# Patient Record
Sex: Male | Born: 1980
Health system: Southern US, Community
[De-identification: ages and names within clinical notes are randomized; demographics above are authoritative.]

## PROBLEM LIST (undated history)

## (undated) DIAGNOSIS — J302 Other seasonal allergic rhinitis: Secondary | ICD-10-CM

## (undated) DIAGNOSIS — Z86018 Personal history of other benign neoplasm: Secondary | ICD-10-CM

## (undated) HISTORY — PX: NO PAST SURGERIES: SHX2092

---
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
## 2011-09-18 ENCOUNTER — Encounter (HOSPITAL_COMMUNITY): Payer: Self-pay

## 2011-09-18 ENCOUNTER — Emergency Department (INDEPENDENT_AMBULATORY_CARE_PROVIDER_SITE_OTHER)
Admission: EM | Admit: 2011-09-18 | Discharge: 2011-09-18 | Disposition: A | Discharge status: Home / Self Care | Payer: 59 | Source: Home / Self Care | Attending: Emergency Medicine | Admitting: Emergency Medicine

## 2011-09-18 DIAGNOSIS — J029 Acute pharyngitis, unspecified: Secondary | ICD-10-CM

## 2011-09-18 HISTORY — DX: Other seasonal allergic rhinitis: J30.2

## 2011-09-18 MED ORDER — ACETAMINOPHEN-CODEINE #3 300-30 MG PO TABS: 1.0000 | ORAL_TABLET | Freq: Four times a day (QID) | ORAL | Status: AC | PRN

## 2011-09-18 NOTE — Discharge Instructions (Signed)
Your strep test was negative. At this point is difficult to determine if this is infectious in nature. Use salt water gargles and monitor temperatures throughout the day. If pain it's about 7 or 8 can use his prescribed medicine otherwise take plain Tylenol or Motrin for discomfort. Return if any further concerns or changes for a recheck    Sore Throat Sore throats may be caused by bacteria and viruses. They may also be caused by:  Smoking.   Pollution.   Allergies.  If a sore throat is due to strep infection (a bacterial infection), you may need:  A throat swab.   A culture test to verify the strep infection.  You will need one of these:  An antibiotic shot.   Oral medicine for a full 10 days.  Strep infection is very contagious. A doctor should check any close contacts who have a sore throat or fever. A sore throat caused by a virus infection will usually last only 3-4 days. Antibiotics will not treat a viral sore throat.  Infectious mononucleosis (a viral disease), however, can cause a sore throat that lasts for up to 3 weeks. Mononucleosis can be diagnosed with blood tests. You must have been sick for at least 1 week in order for the test to give accurate results. HOME CARE INSTRUCTIONS   To treat a sore throat, take mild pain medicine.   Increase your fluids.   Eat a soft diet.   Do not smoke.   Gargling with warm water or salt water (1 tsp. salt in 8 oz. water) can be helpful.   Try throat sprays or lozenges or sucking on hard candy to ease the symptoms.  Call your doctor if your sore throat lasts longer than 1 week.  SEEK IMMEDIATE MEDICAL CARE IF:  You have difficulty breathing.   You have increased swelling in the throat.   You have pain so severe that you are unable to swallow fluids or your saliva.   You have a severe headache, a high fever, vomiting, or a red rash.  Document Released: 07/05/2004 Document Revised: 05/17/2011 Document Reviewed:  05/15/2007 [facility] Patient Information 2012 [place], [place].

## 2011-09-18 NOTE — ED Provider Notes (Signed)
History     CSN: [id number]  Arrival date & time 09/18/11  6213   First MD Initiated Contact with Patient 09/18/11 [phone]      Chief Complaint  Patient presents with  . Sore Throat    (Consider location/radiation/quality/duration/timing/severity/associated sxs/prior treatment) HPI Comments: Patient presents urgent care complaining of a sore throat feeling somewhat fatigue and mild body aches since yesterday. Some discomfort when he swallows. Unable to recall if any fevers. No cough shortness of breath or any upper respiratory congestion.  The history is provided by the patient.    Past Medical History  Diagnosis Date  . Seasonal allergies     History reviewed. No pertinent past surgical history.  No family history on file.  History  Substance Use Topics  . Smoking status: Never Smoker   . Smokeless tobacco: Not on file  . Alcohol Use: No      Review of Systems  Constitutional: Positive for fatigue. Negative for fever, chills, appetite change and unexpected weight change.  HENT: Positive for sore throat. Negative for ear pain, congestion, rhinorrhea, mouth sores, trouble swallowing, neck pain and neck stiffness.   Eyes: Negative for discharge.  Respiratory: Negative for cough, shortness of breath and wheezing.   Cardiovascular: Negative for chest pain, palpitations and leg swelling.  Genitourinary: Negative for dysuria.  Skin: Negative for rash.    Allergies  Review of patient's allergies indicates no known allergies.  Home Medications   Current Outpatient Rx  Name Route Sig Dispense Refill  . ACETAMINOPHEN-CODEINE #3 300-30 MG PO TABS Oral Take 1-2 tablets by mouth every 6 (six) hours as needed for pain. 15 tablet 0    BP 107/66  Pulse 80  Temp(Src) 98.2 F (36.8 C) (Oral)  Resp 12  SpO2 98%  Physical Exam  Nursing note and vitals reviewed. Constitutional: He appears well-developed and well-nourished.  HENT:  Head: Normocephalic.  Right Ear: Tympanic  membrane normal.  Left Ear: Tympanic membrane normal.  Nose: Nose normal.  Mouth/Throat: Uvula is midline and oropharynx is clear and moist. No oropharyngeal exudate, posterior oropharyngeal edema, posterior oropharyngeal erythema or tonsillar abscesses.  Eyes: Conjunctivae are normal. Right eye exhibits no discharge.  Neck: Neck supple.  Cardiovascular: Normal rate.  Exam reveals no gallop and no friction rub.   No murmur heard. Pulmonary/Chest: He exhibits no tenderness.  Abdominal: Soft.  Lymphadenopathy:    He has no cervical adenopathy.  Skin: No rash noted. No erythema.    ED Course  Procedures (including critical care time)   Labs Reviewed  POCT RAPID STREP A ([facility]RG CARE ONLY)   No results found.   1. Pharyngitis       MDM   Patient with pharyngitis for less than 24 hours. Afebrile unremarkable exam patient was screened for strep test results were negative. Symptomatic management recommended next 48 hours to return if any worsening or changes or concerns       Jimmie Molly, MD 09/18/11 [phone]

## 2011-09-18 NOTE — ED Notes (Signed)
C/o sore throat, fatigue and body aches since yesterday am.  Denies cold sx.

## 2012-08-05 ENCOUNTER — Ambulatory Visit (INDEPENDENT_AMBULATORY_CARE_PROVIDER_SITE_OTHER): Payer: 59 | Admitting: Adult Health

## 2012-08-05 ENCOUNTER — Encounter: Payer: Self-pay | Admitting: Adult Health

## 2012-08-05 VITALS — BP 110/80 | HR 76 | Temp 97.8°F | Resp 14 | Ht 70.5 in | Wt 171.0 lb

## 2012-08-05 DIAGNOSIS — B353 Tinea pedis: Secondary | ICD-10-CM | POA: Insufficient documentation

## 2012-08-05 DIAGNOSIS — Z111 Encounter for screening for respiratory tuberculosis: Secondary | ICD-10-CM

## 2012-08-05 DIAGNOSIS — Z0000 Encounter for general adult medical examination without abnormal findings: Secondary | ICD-10-CM | POA: Insufficient documentation

## 2012-08-05 DIAGNOSIS — B079 Viral wart, unspecified: Secondary | ICD-10-CM | POA: Insufficient documentation

## 2012-08-05 LAB — LIPID PANEL
Cholesterol: 207 mg/dL — ABNORMAL HIGH (ref 0–200)
Total CHOL/HDL Ratio: 7
VLDL: 30 mg/dL (ref 0.0–40.0)

## 2012-08-05 LAB — CBC WITH DIFFERENTIAL/PLATELET
Basophils Relative: 0.4 % (ref 0.0–3.0)
Eosinophils Absolute: 0.2 10*3/uL (ref 0.0–0.7)
Hemoglobin: 15.4 g/dL (ref 13.0–17.0)
MCHC: 34 g/dL (ref 30.0–36.0)
MCV: 88.3 fl (ref 78.0–100.0)
Monocytes Absolute: 0.6 10*3/uL (ref 0.1–1.0)
Neutro Abs: 3.7 10*3/uL (ref 1.4–7.7)
RBC: 5.12 Mil/uL (ref 4.22–5.81)
RDW: 12.2 % (ref 11.5–14.6)

## 2012-08-05 LAB — COMPREHENSIVE METABOLIC PANEL
ALT: 30 U/L (ref 0–53)
AST: 25 U/L (ref 0–37)
Calcium: 9.5 mg/dL (ref 8.4–10.5)
Chloride: 101 mEq/L (ref 96–112)
Creatinine, Ser: 1 mg/dL (ref 0.4–1.5)
Sodium: 139 mEq/L (ref 135–145)
Total Bilirubin: 0.7 mg/dL (ref 0.3–1.2)
Total Protein: 8.1 g/dL (ref 6.0–8.3)

## 2012-08-05 MED ORDER — NAFTIFINE HCL 1 % EX CREA: TOPICAL_CREAM | Freq: Every day | CUTANEOUS | Status: DC

## 2012-08-05 NOTE — Progress Notes (Signed)
  Subjective:    Patient ID: Keith Cummings, male    DOB: 1981-01-22, 32 y.o.   MRN: [id number]  HPI  Patient is a very pleasant 32 y/o who presents to clinic to establish care. He was previously followed at [facility] in [place]. Patient does not have any concerns today.   Health Maintenance:  Flu vaccination - 05/2012  Tdap - 1 year ago  Depression Screen - Denies any feelings of depression  Labs-needs routine labs    Review of Systems  Constitutional: Negative for fever, chills, appetite change and fatigue.  HENT: Negative.   Eyes: Negative.  Negative for pain and visual disturbance.  Respiratory: Negative.   Cardiovascular: Negative.   Gastrointestinal: Negative.   Endocrine: Negative.   Genitourinary: Negative for dysuria, hematuria, flank pain, discharge, scrotal swelling, difficulty urinating, penile pain and testicular pain.  Allergic/Immunologic: Positive for environmental allergies.       Seasonal allergies  Neurological: Negative for dizziness, tremors, seizures, weakness, light-headedness and headaches.  Hematological: Negative.   Psychiatric/Behavioral: Negative for suicidal ideas, behavioral problems, confusion, self-injury and agitation. The patient is not nervous/anxious.         Objective:   Physical Exam  Constitutional: He is oriented to person, place, and time. He appears well-developed and well-nourished. No distress.  HENT:  Head: Normocephalic and atraumatic.  Right Ear: External ear normal.  Left Ear: External ear normal.  Nose: Nose normal.  Mouth/Throat: Oropharynx is clear and moist.  Eyes: Conjunctivae and EOM are normal. Pupils are equal, round, and reactive to light.  Neck: Normal range of motion. Neck supple. No tracheal deviation present. No thyromegaly present.  Cardiovascular: Normal rate, regular rhythm and intact distal pulses.  Exam reveals no gallop and no friction rub.   No murmur heard. Pulmonary/Chest: Effort normal  and breath sounds normal. No respiratory distress. He has no wheezes. He has no rales.  Abdominal: Soft. Bowel sounds are normal. He exhibits no distension and no mass. There is no tenderness. There is no rebound.  Musculoskeletal: Normal range of motion. He exhibits no edema and no tenderness.  Lymphadenopathy:    He has no cervical adenopathy.  Neurological: He is alert and oriented to person, place, and time. He has normal reflexes. No cranial nerve deficit. Coordination normal.  Skin: Skin is warm and dry.  Multiple nevi on his back of different sizes. They are symmetric, uniform color, no border irregularity.   Psychiatric: He has a normal mood and affect. His behavior is normal. Judgment and thought content normal.          Assessment & Plan:

## 2012-08-05 NOTE — Assessment & Plan Note (Addendum)
Index finger, PIP joint. Frozen with Histofreezer. RTC if not completely resolved within 3-4 weeks.

## 2012-08-05 NOTE — Assessment & Plan Note (Signed)
Plantar aspect of the left foot. Will start Naftin cream to the affected area once daily.

## 2012-08-05 NOTE — Assessment & Plan Note (Signed)
Normal physical exam except for problems listed below. Will check routine labs CBC, comprehensive metabolic panel, lipid panel. Patient is in the process of becoming a foster parent. He has brought in a form to be filled out for the [facility] division of social services. He will also need a TB skin test. He'll return within 48-72 hours for result of test.

## 2012-08-05 NOTE — Patient Instructions (Addendum)
  Thank you for choosing [facility] for your health care needs.  Please have your labs drawn prior to leaving the office.  Your lab results will be available through MyChart for your convenience. Please remember to activate this.  We are administering a tuberculin skin test. You need to return in 48 - 72 hours for the reading. If you do not return during this time frame you will need to have this redone.  I have started you on Naftin for your athlete's foot. Apply this once daily at bedtime.

## 2012-08-07 ENCOUNTER — Ambulatory Visit: Payer: 59

## 2013-04-16 ENCOUNTER — Other Ambulatory Visit: Payer: Self-pay

## 2013-08-03 ENCOUNTER — Encounter (HOSPITAL_COMMUNITY): Payer: Self-pay | Admitting: Emergency Medicine

## 2013-08-03 ENCOUNTER — Emergency Department (HOSPITAL_COMMUNITY)
Admission: EM | Admit: 2013-08-03 | Discharge: 2013-08-03 | Disposition: A | Discharge status: Home / Self Care | Payer: 59 | Source: Home / Self Care

## 2013-08-03 DIAGNOSIS — R0982 Postnasal drip: Secondary | ICD-10-CM

## 2013-08-03 DIAGNOSIS — J029 Acute pharyngitis, unspecified: Secondary | ICD-10-CM

## 2013-08-03 LAB — POCT RAPID STREP A: STREPTOCOCCUS, GROUP A SCREEN (DIRECT): NEGATIVE

## 2013-08-03 NOTE — Discharge Instructions (Signed)
Pharyngitis °Pharyngitis is redness, pain, and swelling (inflammation) of your pharynx.  °CAUSES  °Pharyngitis is usually caused by infection. Most of the time, these infections are from viruses (viral) and are part of a cold. However, sometimes pharyngitis is caused by bacteria (bacterial). Pharyngitis can also be caused by allergies. Viral pharyngitis may be spread from person to person by coughing, sneezing, and personal items or utensils (cups, forks, spoons, toothbrushes). Bacterial pharyngitis may be spread from person to person by more intimate contact, such as kissing.  °SIGNS AND SYMPTOMS  °Symptoms of pharyngitis include:   °· Sore throat.   °· Tiredness (fatigue).   °· Low-grade fever.   °· Headache. °· Joint pain and muscle aches. °· Skin rashes. °· Swollen lymph nodes. °· Plaque-like film on throat or tonsils (often seen with bacterial pharyngitis). °DIAGNOSIS  °Your health care provider will ask you questions about your illness and your symptoms. Your medical history, along with a physical exam, is often all that is needed to diagnose pharyngitis. Sometimes, a rapid strep test is done. Other lab tests may also be done, depending on the suspected cause.  °TREATMENT  °Viral pharyngitis will usually get better in 3 4 days without the use of medicine. Bacterial pharyngitis is treated with medicines that kill germs (antibiotics).  °HOME CARE INSTRUCTIONS  °· Drink enough water and fluids to keep your urine clear or pale yellow.   °· Only take over-the-counter or prescription medicines as directed by your health care provider:   °· If you are prescribed antibiotics, make sure you finish them even if you start to feel better.   °· Do not take aspirin.   °· Get lots of rest.   °· Gargle with 8 oz of salt water (½ tsp of salt per 1 qt of water) as often as every 1 2 hours to soothe your throat.   °· Throat lozenges (if you are not at risk for choking) or sprays may be used to soothe your throat. °SEEK MEDICAL  CARE IF:  °· You have large, tender lumps in your neck. °· You have a rash. °· You cough up green, yellow-brown, or bloody spit. °SEEK IMMEDIATE MEDICAL CARE IF:  °· Your neck becomes stiff. °· You drool or are unable to swallow liquids. °· You vomit or are unable to keep medicines or liquids down. °· You have severe pain that does not go away with the use of recommended medicines. °· You have trouble breathing (not caused by a stuffy nose). °MAKE SURE YOU:  °· Understand these instructions. °· Will watch your condition. °· Will get help right away if you are not doing well or get worse. °Document Released: 05/28/2005 Document Revised: 03/18/2013 Document Reviewed: 02/02/2013 °[employer]® Patient Information ©2014 [employer]. ° °Sore Throat °A sore throat is pain, burning, irritation, or scratchiness of the throat. There is often pain or tenderness when swallowing or talking. A sore throat may be accompanied by other symptoms, such as coughing, sneezing, fever, and swollen neck glands. A sore throat is often the first sign of another sickness, such as a cold, flu, strep throat, or mononucleosis (commonly known as mono). Most sore throats go away without medical treatment. °CAUSES  °The most common causes of a sore throat include: °· A viral infection, such as a cold, flu, or mono. °· A bacterial infection, such as strep throat, tonsillitis, or whooping cough. °· Seasonal allergies. °· Dryness in the air. °· Irritants, such as smoke or pollution. °· Gastroesophageal reflux disease (GERD). °HOME CARE INSTRUCTIONS  °· Only take over-the-counter   medicines as directed by your caregiver.  Drink enough fluids to keep your urine clear or pale yellow.  Rest as needed.  Try using throat sprays, lozenges, or sucking on hard candy to ease any pain (if older than 4 years or as directed).  Sip warm liquids, such as broth, herbal tea, or warm water with honey to relieve pain temporarily. You may also eat or drink cold or  frozen liquids such as frozen ice pops.  Gargle with salt water (mix 1 tsp salt with 8 oz of water).  Do not smoke and avoid secondhand smoke.  Put a cool-mist humidifier in your bedroom at night to moisten the air. You can also turn on a hot shower and sit in the bathroom with the door closed for 5 10 minutes. SEEK IMMEDIATE MEDICAL CARE IF:  You have difficulty breathing.  You are unable to swallow fluids, soft foods, or your saliva.  You have increased swelling in the throat.  Your sore throat does not get better in 7 days.  You have nausea and vomiting.  You have a fever or persistent symptoms for more than 2 3 days.  You have a fever and your symptoms suddenly get worse. MAKE SURE YOU:   Understand these instructions.  Will watch your condition.  Will get help right away if you are not doing well or get worse. Document Released: 07/05/2004 Document Revised: 05/14/2012 Document Reviewed: 02/03/2012 [facility] Patient Information 2014 [place], [place].  Upper Respiratory Infection, Adult An upper respiratory infection (URI) is also known as the common cold. It is often caused by a type of germ (virus). Colds are easily spread (contagious). You can pass it to others by kissing, coughing, sneezing, or drinking out of the same glass. Usually, you get better in 1 or 2 weeks.  HOME CARE   Only take medicine as told by your doctor.  Use a warm mist humidifier or breathe in steam from a hot shower.  Drink enough water and fluids to keep your pee (urine) clear or pale yellow.  Get plenty of rest.  Return to work when your temperature is back to normal or as told by your doctor. You may use a face mask and wash your hands to stop your cold from spreading. GET HELP RIGHT AWAY IF:   After the first few days, you feel you are getting worse.  You have questions about your medicine.  You have chills, shortness of breath, or brown or red spit (mucus).  You have yellow or brown  snot (nasal discharge) or pain in the face, especially when you bend forward.  You have a fever, puffy (swollen) neck, pain when you swallow, or white spots in the back of your throat.  You have a bad headache, ear pain, sinus pain, or chest pain.  You have a high-pitched whistling sound when you breathe in and out (wheezing).  You have a lasting cough or cough up blood.  You have sore muscles or a stiff neck. MAKE SURE YOU:   Understand these instructions.  Will watch your condition.  Will get help right away if you are not doing well or get worse. Document Released: 11/14/2007 Document Revised: 08/20/2011 Document Reviewed: 10/02/2010 [facility] Patient Information 2014 [place], [place].

## 2013-08-03 NOTE — ED Provider Notes (Signed)
CSN: [id number]     Arrival date & time 08/03/13  0805 History   First MD Initiated Contact with Patient 08/03/13 [phone]     Chief Complaint  Patient presents with  . Sore Throat     (Consider location/radiation/quality/duration/timing/severity/associated sxs/prior Treatment) HPI Comments: C/O sore throat for 3 d.   Past Medical History  Diagnosis Date  . Seasonal allergies    History reviewed. No pertinent past surgical history. Family History  Problem Relation Age of Onset  . Mental illness Mother   . Hyperlipidemia Father   . Hypertension Father   . Hyperlipidemia Sister   . Cancer Maternal Grandmother     uterus  . Cancer Paternal Grandfather     prostate, bone and lung cancer  . Diabetes Neg Hx    History  Substance Use Topics  . Smoking status: Never Smoker   . Smokeless tobacco: Never Used  . Alcohol Use: Yes     Comment: Does not drink regularly. Just has occasional beer possibly every couple of months.    Review of Systems  Constitutional: Negative.   HENT: Positive for sore throat. Negative for congestion, ear discharge, mouth sores, nosebleeds, postnasal drip, rhinorrhea, sinus pressure, sneezing and trouble swallowing.   Respiratory: Negative.   Cardiovascular: Negative.   Gastrointestinal: Negative.   Musculoskeletal: Negative.   Neurological: Negative.       Allergies  Review of patient's allergies indicates no known allergies.  Home Medications   Current Outpatient Rx  Name  Route  Sig  Dispense  Refill  . loratadine (CLARITIN) 10 MG tablet   Oral   Take 10 mg by mouth daily.         . Multiple Vitamin (MULTIVITAMIN) tablet   Oral   Take 1 tablet by mouth daily.         . naftifine (NAFTIN) 1 % cream   Topical   Apply topically daily.   30 g   0    BP 118/77  Pulse 83  Temp(Src) 98.6 F (37 C) (Oral)  Resp 16  SpO2 98% Physical Exam  Nursing note and vitals reviewed. Constitutional: He is oriented to person, place, and  time. He appears well-developed and well-nourished. No distress.  HENT:  Head: Normocephalic and atraumatic.  Bilat TM's nl Minor, streaky erythema.  No swelling or exudates.  Eyes: Conjunctivae and EOM are normal.  Neck: Normal range of motion. Neck supple.  Cardiovascular: Normal rate, regular rhythm and normal heart sounds.   Pulmonary/Chest: Effort normal and breath sounds normal. No respiratory distress. He has no wheezes.  Abdominal: Soft. There is no tenderness.  Musculoskeletal: He exhibits no edema and no tenderness.  Lymphadenopathy:    He has no cervical adenopathy.  Neurological: He is alert and oriented to person, place, and time.  Skin: Skin is warm and dry.  Psychiatric: He has a normal mood and affect.    ED Course  Procedures (including critical care time) Labs Review Labs Reviewed  POCT RAPID STREP A ([facility] ONLY)   Imaging Review No results found.    Results for orders placed during the hospital encounter of 08/03/13  POCT RAPID STREP A ([facility] ONLY)      Result Value Ref Range   Streptococcus, Group A Screen (Direct) NEGATIVE  NEGATIVE     MDM   Final diagnoses:  Pharyngitis  PND (post-nasal drip)    Cepacol Loz Plenty of fluids Allegra or Claritin for drainage.    Shanon Brow  Lotta Frankenfield, NP 08/03/13 [phone]

## 2013-08-03 NOTE — ED Notes (Signed)
C/o sore throat since 2-20; minimal relief w home remedies, OTC medications. Posterior nasopharynx, slightly reddened , tonsils absent

## 2013-08-04 NOTE — ED Provider Notes (Signed)
Medical screening examination/treatment/procedure(s) were performed by non-physician practitioner and as supervising physician I was immediately available for consultation/collaboration.  Philipp Deputy, M.D.  Harden Mo, MD 08/04/13 [phone]

## 2013-08-05 LAB — CULTURE, GROUP A STREP

## 2013-09-29 ENCOUNTER — Encounter: Payer: Self-pay | Admitting: Adult Health

## 2013-09-29 ENCOUNTER — Ambulatory Visit (INDEPENDENT_AMBULATORY_CARE_PROVIDER_SITE_OTHER): Payer: 59 | Admitting: Adult Health

## 2013-09-29 VITALS — BP 110/64 | HR 74 | Temp 97.8°F | Resp 14 | Wt 171.0 lb

## 2013-09-29 DIAGNOSIS — R0982 Postnasal drip: Secondary | ICD-10-CM

## 2013-09-29 MED ORDER — FLUTICASONE PROPIONATE 50 MCG/ACT NA SUSP: 2.0000 | Freq: Every day | NASAL | Status: DC

## 2013-09-29 NOTE — Progress Notes (Signed)
Pre visit review using our clinic review tool, if applicable. No additional management support is needed unless otherwise documented below in the visit note. 

## 2013-09-29 NOTE — Progress Notes (Signed)
   Subjective:    Patient ID: Keith Cummings, male    DOB: 22-Apr-1981, 33 y.o.   MRN: [id number]  HPI Pt is a pleasant 33 y/o male who presents to clinic with post nasal drip, allergy symptoms and sore throat. He is taking Allegra daily. Patient reports that he was seen in urgent care approximately one month ago with the same symptoms. Supportive care ordered at that time as well. His symptoms resolved. He began to experience these new symptoms approximately 2-3 days ago. Reports clear drainage from sinuses. Postnasal drip which is irritating his throat. Very little cough. No fever. Does feel slightly fatigued.  Current Outpatient Prescriptions on File Prior to Visit  Medication Sig Dispense Refill  . Multiple Vitamin (MULTIVITAMIN) tablet Take 1 tablet by mouth daily.       No current facility-administered medications on file prior to visit.    Review of Systems  Constitutional: Positive for fatigue. Negative for fever and chills.  HENT: Positive for postnasal drip, rhinorrhea and sore throat. Negative for congestion and ear pain.   Respiratory: Positive for cough (mild). Negative for shortness of breath and wheezing.   Cardiovascular: Negative.   Gastrointestinal: Negative.   Genitourinary: Negative.   Neurological: Negative for headaches.  All other systems reviewed and are negative.      Objective:   Physical Exam  Constitutional: He is oriented to person, place, and time. He appears well-developed and well-nourished. No distress.  HENT:  Head: Normocephalic and atraumatic.  Right Ear: External ear normal.  Left Ear: External ear normal.  Mouth/Throat: No oropharyngeal exudate.  Cobblestone appearance of throat. Slightly erythematous without exudate.  Eyes: Conjunctivae and EOM are normal.  Neck: Normal range of motion. Neck supple.  Cardiovascular: Normal rate, regular rhythm and normal heart sounds.  Exam reveals no gallop and no friction rub.   No murmur  heard. Pulmonary/Chest: Effort normal and breath sounds normal. No respiratory distress. He has no wheezes. He has no rales.  Musculoskeletal: Normal range of motion.  Lymphadenopathy:    He has no cervical adenopathy.  Neurological: He is alert and oriented to person, place, and time.  Skin: Skin is warm and dry.  Psychiatric: He has a normal mood and affect. His behavior is normal. Judgment and thought content normal.       Assessment & Plan:   1. Post-nasal drip Suspect symptoms are allergic in nature. Well appearing male. Start flonase as directed. Continue Allegra. May use saline spray and irrigate sinuses with simple saline Chloraseptic for sore throat. Pt will call if symptoms worsen.

## 2013-09-29 NOTE — Patient Instructions (Signed)
  Start Fluticasone (flonase) 2 sprays into each nostril daily.  Use saline spray as often as you like. Keeps sinuses moist and help irrigate.  You may try irrigating your sinuses with Simple Saline which is sold over the counter.  Continue Allegra daily.  Chloraseptic spray for your sore throat. You may also try sucking on ice or lozenges. Tea with honey may also help soothe your throat.  If you develop a fever of 101 or greater, begin to have green secretions from your nose please let me know and I will call in an antibiotic.

## 2013-11-12 ENCOUNTER — Encounter: Payer: Self-pay | Admitting: Family Medicine

## 2013-11-12 ENCOUNTER — Ambulatory Visit (INDEPENDENT_AMBULATORY_CARE_PROVIDER_SITE_OTHER): Payer: 59 | Admitting: Family Medicine

## 2013-11-12 VITALS — BP 100/70 | HR 80 | Temp 98.3°F | Ht 70.5 in | Wt 168.0 lb

## 2013-11-12 DIAGNOSIS — R109 Unspecified abdominal pain: Secondary | ICD-10-CM

## 2013-11-12 DIAGNOSIS — G8929 Other chronic pain: Secondary | ICD-10-CM | POA: Insufficient documentation

## 2013-11-12 LAB — POCT URINALYSIS DIPSTICK
Bilirubin, UA: NEGATIVE
Glucose, UA: NEGATIVE
Ketones, UA: NEGATIVE
Leukocytes, UA: NEGATIVE
NITRITE UA: NEGATIVE
PH UA: 8
RBC UA: NEGATIVE
SPEC GRAV UA: 1.01
UROBILINOGEN UA: 0.2

## 2013-11-12 NOTE — Progress Notes (Signed)
Pre visit review using our clinic review tool, if applicable. No additional management support is needed unless otherwise documented below in the visit note. 

## 2013-11-12 NOTE — Addendum Note (Signed)
Addended by: Carter Kitten on: 11/12/2013 10:15 AM   Modules accepted: Orders

## 2013-11-12 NOTE — Progress Notes (Signed)
   Subjective:    Patient ID: Keith Cummings, male    DOB: 1980-08-31, 33 y.o.   MRN: [id number]  Flank Pain Associated symptoms include abdominal pain. Pertinent negatives include no chest pain or fever.   33 year old male pt of Dr. Maren Beach presents to today with chronic onset pain in left flank sharp, with acute worsening. He has had intermittent pain in flank in last year.  Had episode yesterday, more severe. 5/10 on pain scale. Lasted 1 minute. Felt like sharp stabbing pain.  Occurred at rest. No food trigger, no clear movement trigger.  Went away on own.  Occuring few times month, no more frequent lately. No N, V, D. No constipation. Regular BMs.  No fever.  No dysuria, no hematuria.  PMH: No kidney stones, no past UTI.     Review of Systems  Constitutional: Negative for fever and fatigue.  HENT: Negative for ear pain.   Eyes: Negative for pain.  Respiratory: Negative for cough.   Cardiovascular: Negative for chest pain.  Gastrointestinal: Positive for abdominal pain. Negative for blood in stool.  Genitourinary: Positive for flank pain.       Objective:   Physical Exam  Constitutional: Vital signs are normal. He appears well-developed and well-nourished.  HENT:  Head: Normocephalic.  Right Ear: Hearing normal.  Left Ear: Hearing normal.  Nose: Nose normal.  Mouth/Throat: Oropharynx is clear and moist and mucous membranes are normal.  Neck: Trachea normal. Carotid bruit is not present. No mass and no thyromegaly present.  Cardiovascular: Normal rate, regular rhythm and normal pulses.  Exam reveals no gallop, no distant heart sounds and no friction rub.   No murmur heard. No peripheral edema  Pulmonary/Chest: Effort normal and breath sounds normal. No respiratory distress.  Skin: Skin is warm, dry and intact. No rash noted.  Psychiatric: He has a normal mood and affect. His speech is normal and behavior is normal. Thought content normal.          Assessment &  Plan:

## 2013-11-12 NOTE — Assessment & Plan Note (Signed)
UA clear.  No pain on exam.  ? Secondary to IBS vs muscle strain ( pt has poor posture). Start with increasing water and fiber in diet, avoid greasy foods. Stress reduction. Follow up with PCP for annual exam as overdue, recheck at that time.

## 2013-11-12 NOTE — Patient Instructions (Addendum)
Start with increasing water and fiber in diet, avoid greasy foods. Stress reduction. Follow up with PCP for annual exam as overdue, recheck at that time.   Irritable Bowel Syndrome Irritable Bowel Syndrome (IBS) is caused by a disturbance of normal bowel function. Other terms used are spastic colon, mucous colitis, and irritable colon. It does not require surgery, nor does it lead to cancer. There is no cure for IBS. But with proper diet, stress reduction, and medication, you will find that your problems (symptoms) will gradually disappear or improve. IBS is a common digestive disorder. It usually appears in late adolescence or early adulthood. Women develop it twice as often as men. CAUSES  After food has been digested and absorbed in the small intestine, waste material is moved into the colon (large intestine). In the colon, water and salts are absorbed from the undigested products coming from the small intestine. The remaining residue, or fecal material, is held for elimination. Under normal circumstances, gentle, rhythmic contractions on the bowel walls push the fecal material along the colon towards the rectum. In IBS, however, these contractions are irregular and poorly coordinated. The fecal material is either retained too long, resulting in constipation, or expelled too soon, producing diarrhea. SYMPTOMS  The most common symptom of IBS is pain. It is typically in the lower left side of the belly (abdomen). But it may occur anywhere in the abdomen. It can be felt as heartburn, backache, or even as a dull pain in the arms or shoulders. The pain comes from excessive bowel-muscle spasms and from the buildup of gas and fecal material in the colon. This pain:  Can range from sharp belly (abdominal) cramps to a dull, continuous ache.  Usually worsens soon after eating.  Is typically relieved by having a bowel movement or passing gas. Abdominal pain is usually accompanied by constipation. But it may  also produce diarrhea. The diarrhea typically occurs right after a meal or upon arising in the morning. The stools are typically soft and watery. They are often flecked with secretions (mucus). Other symptoms of IBS include:  Bloating.  Loss of appetite.  Heartburn.  Feeling sick to your stomach (nausea).  Belching  Vomiting  Gas. IBS may also cause a number of symptoms that are unrelated to the digestive system:  Fatigue.  Headaches.  Anxiety  Shortness of breath  Difficulty in concentrating.  Dizziness. These symptoms tend to come and go. DIAGNOSIS  The symptoms of IBS closely mimic the symptoms of other, more serious digestive disorders. So your caregiver may wish to perform a variety of additional tests to exclude these disorders. He/she wants to be certain of learning what is wrong (diagnosis). The nature and purpose of each test will be explained to you. TREATMENT A number of medications are available to help correct bowel function and/or relieve bowel spasms and abdominal pain. Among the drugs available are:  Mild, non-irritating laxatives for severe constipation and to help restore normal bowel habits.  Specific anti-diarrheal medications to treat severe or prolonged diarrhea.  Anti-spasmodic agents to relieve intestinal cramps.  Your caregiver may also decide to treat you with a mild tranquilizer or sedative during unusually stressful periods in your life. The important thing to remember is that if any drug is prescribed for you, make sure that you take it exactly as directed. Make sure that your caregiver knows how well it worked for you. HOME CARE INSTRUCTIONS   Avoid foods that are high in fat or oils. Some examples  DPO:EUMPN cream, butter, frankfurters, sausage, and other fatty meats.  Avoid foods that have a laxative effect, such as fruit, fruit juice, and dairy products.  Cut out carbonated drinks, chewing gum, and "gassy" foods, such as beans and  cabbage. This may help relieve bloating and belching.  Bran taken with plenty of liquids may help relieve constipation.  Keep track of what foods seem to trigger your symptoms.  Avoid emotionally charged situations or circumstances that produce anxiety.  Start or continue exercising.  Get plenty of rest and sleep. MAKE SURE YOU:   Understand these instructions.  Will watch your condition.  Will get help right away if you are not doing well or get worse. Document Released: 05/28/2005 Document Revised: 08/20/2011 Document Reviewed: 01/16/2008 [facility] Patient Information 2014 [place].

## 2013-12-18 ENCOUNTER — Ambulatory Visit (INDEPENDENT_AMBULATORY_CARE_PROVIDER_SITE_OTHER): Payer: 59 | Admitting: Adult Health

## 2013-12-18 ENCOUNTER — Encounter: Payer: Self-pay | Admitting: Adult Health

## 2013-12-18 VITALS — BP 101/69 | HR 86 | Temp 97.9°F | Resp 14 | Ht 70.5 in | Wt 170.5 lb

## 2013-12-18 DIAGNOSIS — Z1283 Encounter for screening for malignant neoplasm of skin: Secondary | ICD-10-CM

## 2013-12-18 DIAGNOSIS — Z0000 Encounter for general adult medical examination without abnormal findings: Secondary | ICD-10-CM

## 2013-12-18 NOTE — Progress Notes (Signed)
Pre visit review using our clinic review tool, if applicable. No additional management support is needed unless otherwise documented below in the visit note. 

## 2013-12-18 NOTE — Progress Notes (Signed)
Patient ID: Keith Cummings, male   DOB: 1981-01-20, 33 y.o.   MRN: [id number]   Subjective:    Patient ID: Keith Cummings, male    DOB: February 25, 1981, 33 y.o.   MRN: [id number]  HPI  Keith Cummings is a pleasant 33 y/o male who presents to clinic for his annual physical exam. He is doing well. 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 He and his wife are foster parents to 2 girls. Home life stable. No concerns this visit.    Past Medical History  Diagnosis Date  . Seasonal allergies      No past surgical history on file.   Family History  Problem Relation Age of Onset  . Mental illness Mother   . Hyperlipidemia Father   . Hypertension Father   . Hyperlipidemia Sister   . Cancer Maternal Grandmother     uterus  . Cancer Paternal Grandfather     prostate, bone and lung cancer  . Diabetes Neg Hx      History   Social History  . Marital Status: Married    Spouse Name: N/A    Number of Children: N/A  . Years of Education: N/A   Occupational History  . Not on file.   Social History Main Topics  . Smoking status: Never Smoker   . Smokeless tobacco: Never Used  . Alcohol Use: Yes     Comment: Does not drink regularly. Just has occasional beer possibly every couple of months.  . Drug Use: No  . Sexual Activity: Yes   Other Topics Concern  . Not on file   Social History Narrative  . No narrative on file    Current Outpatient Prescriptions on File Prior to Visit  Medication Sig Dispense Refill  . fexofenadine (ALLEGRA) 180 MG tablet Take 180 mg by mouth daily.      . Multiple Vitamin (MULTIVITAMIN) tablet Take 1 tablet by mouth daily.      . Omega-3 Fatty Acids (FISH OIL) 1000 MG CAPS Take 1 capsule by mouth daily.       No current facility-administered medications on file prior to visit.     Review of Systems  Constitutional: Negative.   HENT: Negative.   Eyes: Negative.   Respiratory: Negative.   Cardiovascular: Negative.     Gastrointestinal: Negative.   Endocrine: Negative.   Genitourinary: Negative.   Musculoskeletal: Negative.   Skin: Negative.   Allergic/Immunologic: Negative.   Neurological: Negative.   Hematological: Negative.   Psychiatric/Behavioral: Negative.        Objective:  BP 101/69  Pulse 86  Temp(Src) 97.9 F (36.6 C) (Oral)  Resp 14  Ht 5' 10.5" (1.791 m)  Wt 170 lb 8 oz (77.338 kg)  BMI 24.11 kg/m2  SpO2 99%   Physical Exam  Constitutional: He is oriented to person, place, and time. He appears well-developed and well-nourished. No distress.  HENT:  Head: Normocephalic and atraumatic.  Right Ear: External ear normal.  Left Ear: External ear normal.  Nose: Nose normal.  Mouth/Throat: Oropharynx is clear and moist.  Eyes: Conjunctivae and EOM are normal. Pupils are equal, round, and reactive to light.  Neck: Normal range of motion. Neck supple. No tracheal deviation present. No thyromegaly present.  Cardiovascular: Normal rate, regular rhythm, normal heart sounds and intact distal pulses.  Exam reveals no gallop and no friction rub.   No murmur heard. Pulmonary/Chest: Effort normal and breath sounds normal. No respiratory distress. He has no  wheezes. He has no rales.  Abdominal: Soft. Bowel sounds are normal. He exhibits no distension and no mass. There is no tenderness. There is no rebound and no guarding.  Musculoskeletal: Normal range of motion. He exhibits no edema and no tenderness.  Lymphadenopathy:    He has no cervical adenopathy.  Neurological: He is alert and oriented to person, place, and time. He has normal reflexes. No cranial nerve deficit. Coordination normal.  Skin: Skin is warm and dry.  Nevi on back. Family hx of pre melanoma grandfather and father.  Psychiatric: He has a normal mood and affect. His behavior is normal. Judgment and thought content normal.      Assessment & Plan:   1. Routine general medical examination at a health care facility Normal  physical exam. UTD on all vaccinations. Screenings addressed and listed separately. Return in 1 year or prn.  2. Screening for skin cancer Family hx of pre melanoma in father and grandfather. Nevi on back. Fair skin. Will refer for screening to [facility].  - Ambulatory referral to Dermatology

## 2013-12-18 NOTE — Patient Instructions (Signed)
  You had your annual physical exam today which was normal.  I am referring you to Dermatology for a skin assessment given your family hx and you having multiple moles.  We will contact you with an appointment.  Return for your physical in 1 year.  Follow healthy diet and exercise program.

## 2014-02-25 DIAGNOSIS — D239 Other benign neoplasm of skin, unspecified: Secondary | ICD-10-CM

## 2014-02-25 HISTORY — DX: Other benign neoplasm of skin, unspecified: D23.9

## 2014-05-11 ENCOUNTER — Encounter: Payer: Self-pay | Admitting: Family Medicine

## 2014-12-20 ENCOUNTER — Encounter: Payer: Self-pay | Admitting: Nurse Practitioner

## 2014-12-20 ENCOUNTER — Ambulatory Visit (INDEPENDENT_AMBULATORY_CARE_PROVIDER_SITE_OTHER): Payer: 59 | Admitting: Nurse Practitioner

## 2014-12-20 VITALS — BP 106/68 | HR 68 | Temp 98.2°F | Resp 16 | Ht 70.5 in | Wt 180.8 lb

## 2014-12-20 DIAGNOSIS — Z1283 Encounter for screening for malignant neoplasm of skin: Secondary | ICD-10-CM

## 2014-12-20 DIAGNOSIS — Z0000 Encounter for general adult medical examination without abnormal findings: Secondary | ICD-10-CM | POA: Diagnosis not present

## 2014-12-20 DIAGNOSIS — J302 Other seasonal allergic rhinitis: Secondary | ICD-10-CM | POA: Diagnosis not present

## 2014-12-20 NOTE — Assessment & Plan Note (Signed)
Discussed acute and chronic issues. Reviewed health maintenance measures, PFSHx, and immunizations. Obtain routine future labs TSH, Lipid panel, CBC w/ diff, A1c, and CMET. Pt not fasting today.

## 2014-12-20 NOTE — Progress Notes (Signed)
Subjective:    Patient ID: Keith Cummings, male    DOB: November 12, 1980, 34 y.o.   MRN: [id number]  HPI  Mr. Mandich is a 34 yo male with a need for annual physical.   1) Health Maintenance-   Diet- Staying away from fried foods generally, eating a little healthier he reports over the past two months   Exercise- 2-3 x a week walking 20-30 min, push mowing 60 min weekly   Immunizations- UTD  Eye Exam- Not UTD  Dental Exam- UTD  2) Chronic Problems-  Allergies- Claritin and Zyrtec had drainage, switched to Allegra, which helped.    Nevus- removed 2-3 per pt, pre-cancerous, yearly full body check, uses sunscreen when going outside.   3) Acute Problems- No refills needed   Review of Systems  Constitutional: Negative for fever, chills, diaphoresis and fatigue.  HENT: Negative for tinnitus and trouble swallowing.   Eyes: Negative for visual disturbance.  Respiratory: Positive for chest tightness. Negative for shortness of breath and wheezing.        3 days ago- lasted for 30 min-45 min after a meal- feels it was heartburn   Cardiovascular: Negative for chest pain, palpitations and leg swelling.  Gastrointestinal: Positive for diarrhea. Negative for nausea, vomiting and constipation.       Happened one Sunday/Monday green diarrhea after eating BBQ  Genitourinary: Negative for difficulty urinating.  Musculoskeletal: Negative for myalgias, back pain and neck pain.  Skin: Negative for rash.  Allergic/Immunologic: Positive for environmental allergies.  Neurological: Negative for dizziness, weakness, numbness and headaches.  Hematological: Does not bruise/bleed easily.  Psychiatric/Behavioral: Negative for suicidal ideas and sleep disturbance. The patient is not nervous/anxious.    Past Medical History  Diagnosis Date  . Seasonal allergies     History   Social History  . Marital Status: Married    Spouse Name: N/A  . Number of Children: N/A  . Years of Education: N/A    Occupational History  . Not on file.   Social History Main Topics  . Smoking status: Never Smoker   . Smokeless tobacco: Never Used  . Alcohol Use: 0.0 oz/week    0 Standard drinks or equivalent per week     Comment: Does not drink regularly. Just has occasional beer possibly every couple of months.  . Drug Use: No  . Sexual Activity:    Partners: Female     Comment: Wife   Other Topics Concern  . Not on file   Social History Narrative   Works at [employer] in [place] with wife and no children    Pets: 1 dog and 1 snake    Caffeine- 1-2 cups of coffee/soda/tea daily    Enjoys movies, computer games, reading     No past surgical history on file.  Family History  Problem Relation Age of Onset  . Mental illness Mother   . Hyperlipidemia Father   . Hypertension Father   . Hyperlipidemia Sister   . Cancer Maternal Grandmother     uterus  . Cancer Paternal Grandfather     prostate, bone and lung cancer  . Diabetes Neg Hx     No Known Allergies  Current Outpatient Prescriptions on File Prior to Visit  Medication Sig Dispense Refill  . Multiple Vitamin (MULTIVITAMIN) tablet Take 1 tablet by mouth daily.    . Omega-3 Fatty Acids (FISH OIL) 1000 MG CAPS Take 1 capsule by mouth daily.    Marland Kitchen  fexofenadine (ALLEGRA) 180 MG tablet Take 180 mg by mouth daily.     No current facility-administered medications on file prior to visit.      Objective:   Physical Exam  Constitutional: He is oriented to person, place, and time. He appears well-developed and well-nourished. No distress.  BP 106/68 mmHg  Pulse 68  Temp(Src) 98.2 F (36.8 C) (Oral)  Resp 16  Ht 5' 10.5" (1.791 m)  Wt 180 lb 12.8 oz (82.01 kg)  BMI 25.57 kg/m2  SpO2 96%   HENT:  Head: Normocephalic and atraumatic.  Right Ear: External ear normal.  Left Ear: External ear normal.  Nose: Nose normal.  Mouth/Throat: Oropharynx is clear and moist. No oropharyngeal exudate.  TM's clear  bilaterally  Eyes: Conjunctivae and EOM are normal. Pupils are equal, round, and reactive to light. Right eye exhibits no discharge. Left eye exhibits no discharge. No scleral icterus.  Neck: Normal range of motion. Neck supple. No thyromegaly present.  Cardiovascular: Normal rate, regular rhythm, normal heart sounds and intact distal pulses.  Exam reveals no gallop and no friction rub.   No murmur heard. Pulmonary/Chest: Effort normal and breath sounds normal. No respiratory distress. He has no wheezes. He has no rales. He exhibits no tenderness.  Abdominal: Soft. Bowel sounds are normal. He exhibits no distension and no mass. There is no tenderness. There is no rebound and no guarding.  Genitourinary:  Deferred  Musculoskeletal: Normal range of motion. He exhibits no edema or tenderness.  Lymphadenopathy:    He has no cervical adenopathy.  Neurological: He is alert and oriented to person, place, and time. He displays normal reflexes. No cranial nerve deficit. He exhibits normal muscle tone. Coordination normal.  Skin: Skin is warm and dry. No rash noted. He is not diaphoretic.  Psychiatric: He has a normal mood and affect. His behavior is normal. Judgment and thought content normal.      Assessment & Plan:

## 2014-12-20 NOTE — Patient Instructions (Signed)
Please make a fasting lab appointment.   See you in 1 year for you physical. Call anytime you need to be seen for anything else.   Health Maintenance A healthy lifestyle and preventative care can promote health and wellness.  Maintain regular health, dental, and eye exams.  Eat a healthy diet. Foods like vegetables, fruits, whole grains, low-fat dairy products, and lean protein foods contain the nutrients you need and are low in calories. Decrease your intake of foods high in solid fats, added sugars, and salt. Get information about a proper diet from your health care provider, if necessary.  Regular physical exercise is one of the most important things you can do for your health. Most adults should get at least 150 minutes of moderate-intensity exercise (any activity that increases your heart rate and causes you to sweat) each week. In addition, most adults need muscle-strengthening exercises on 2 or more days a week.   Maintain a healthy weight. The body mass index (BMI) is a screening tool to identify possible weight problems. It provides an estimate of body fat based on height and weight. Your health care provider can find your BMI and can help you achieve or maintain a healthy weight. For males 20 years and older:  A BMI below 18.5 is considered underweight.  A BMI of 18.5 to 24.9 is normal.  A BMI of 25 to 29.9 is considered overweight.  A BMI of 30 and above is considered obese.  Maintain normal blood lipids and cholesterol by exercising and minimizing your intake of saturated fat. Eat a balanced diet with plenty of fruits and vegetables. Blood tests for lipids and cholesterol should begin at age 96 and be repeated every 5 years. If your lipid or cholesterol levels are high, you are over age 60, or you are at high risk for heart disease, you may need your cholesterol levels checked more frequently.Ongoing high lipid and cholesterol levels should be treated with medicines if diet and  exercise are not working.  If you smoke, find out from your health care provider how to quit. If you do not use tobacco, do not start.  Lung cancer screening is recommended for adults aged 93-80 years who are at high risk for developing lung cancer because of a history of smoking. A yearly low-dose CT scan of the lungs is recommended for people who have at least a 30-pack-year history of smoking and are current smokers or have quit within the past 15 years. A pack year of smoking is smoking an average of 1 pack of cigarettes a day for 1 year (for example, a 30-pack-year history of smoking could mean smoking 1 pack a day for 30 years or 2 packs a day for 15 years). Yearly screening should continue until the smoker has stopped smoking for at least 15 years. Yearly screening should be stopped for people who develop a health problem that would prevent them from having lung cancer treatment.  If you choose to drink alcohol, do not have more than 2 drinks per day. One drink is considered to be 12 oz (360 mL) of beer, 5 oz (150 mL) of wine, or 1.5 oz (45 mL) of liquor.  Avoid the use of street drugs. Do not share needles with anyone. Ask for help if you need support or instructions about stopping the use of drugs.  High blood pressure causes heart disease and increases the risk of stroke. Blood pressure should be checked at least every 1-2 years. Ongoing high  blood pressure should be treated with medicines if weight loss and exercise are not effective.  If you are 67-21 years old, ask your health care provider if you should take aspirin to prevent heart disease.  Diabetes screening involves taking a blood sample to check your fasting blood sugar level. This should be done once every 3 years after age 9 if you are at a normal weight and without risk factors for diabetes. Testing should be considered at a younger age or be carried out more frequently if you are overweight and have at least 1 risk factor for  diabetes.  Colorectal cancer can be detected and often prevented. Most routine colorectal cancer screening begins at the age of 78 and continues through age 38. However, your health care provider may recommend screening at an earlier age if you have risk factors for colon cancer. On a yearly basis, your health care provider may provide home test kits to check for hidden blood in the stool. A small camera at the end of a tube may be used to directly examine the colon (sigmoidoscopy or colonoscopy) to detect the earliest forms of colorectal cancer. Talk to your health care provider about this at age 4 when routine screening begins. A direct exam of the colon should be repeated every 5-10 years through age 18, unless early forms of precancerous polyps or small growths are found.  People who are at an increased risk for hepatitis B should be screened for this virus. You are considered at high risk for hepatitis B if:  You were born in a country where hepatitis B occurs often. Talk with your health care provider about which countries are considered high risk.  Your parents were born in a high-risk country and you have not received a shot to protect against hepatitis B (hepatitis B vaccine).  You have HIV or AIDS.  You use needles to inject street drugs.  You live with, or have sex with, someone who has hepatitis B.  You are a man who has sex with other men (MSM).  You get hemodialysis treatment.  You take certain medicines for conditions like cancer, organ transplantation, and autoimmune conditions.  Hepatitis C blood testing is recommended for all people born from [birth year] through 1963 and any individual with known risk factors for hepatitis C.  Healthy men should no longer receive prostate-specific antigen (PSA) blood tests as part of routine cancer screening. Talk to your health care provider about prostate cancer screening.  Testicular cancer screening is not recommended for adolescents or  adult males who have no symptoms. Screening includes self-exam, a health care provider exam, and other screening tests. Consult with your health care provider about any symptoms you have or any concerns you have about testicular cancer.  Practice safe sex. Use condoms and avoid high-risk sexual practices to reduce the spread of sexually transmitted infections (STIs).  You should be screened for STIs, including gonorrhea and chlamydia if:  You are sexually active and are younger than 24 years.  You are older than 24 years, and your health care provider tells you that you are at risk for this type of infection.  Your sexual activity has changed since you were last screened, and you are at an increased risk for chlamydia or gonorrhea. Ask your health care provider if you are at risk.  If you are at risk of being infected with HIV, it is recommended that you take a prescription medicine daily to prevent HIV infection. This is called  pre-exposure prophylaxis (PrEP). You are considered at risk if:  You are a man who has sex with other men (MSM).  You are a heterosexual man who is sexually active with multiple partners.  You take drugs by injection.  You are sexually active with a partner who has HIV.  Talk with your health care provider about whether you are at high risk of being infected with HIV. If you choose to begin PrEP, you should first be tested for HIV. You should then be tested every 3 months for as long as you are taking PrEP.  Use sunscreen. Apply sunscreen liberally and repeatedly throughout the day. You should seek shade when your shadow is shorter than you. Protect yourself by wearing long sleeves, pants, a wide-brimmed hat, and sunglasses year round whenever you are outdoors.  Tell your health care provider of new moles or changes in moles, especially if there is a change in shape or color. Also, tell your health care provider if a mole is larger than the size of a pencil  eraser.  A one-time screening for abdominal aortic aneurysm (AAA) and surgical repair of large AAAs by ultrasound is recommended for men aged 12-75 years who are current or former smokers.  Stay current with your vaccines (immunizations). Document Released: 11/24/2007 Document Revised: 06/02/2013 Document Reviewed: 10/23/2010 [facility] Patient Information 2015 [place], [place]. This information is not intended to replace advice given to you by your health care provider. Make sure you discuss any questions you have with your health care provider.

## 2014-12-20 NOTE — Assessment & Plan Note (Signed)
Pt switching between Allegra/Claritin/Zyrtec. He reports it works for [employer]" then he gets rhinorrhea. Asked him to continue switching and maybe pick 1 per season.

## 2014-12-20 NOTE — Assessment & Plan Note (Signed)
Yearly full body screen by Dermatology

## 2014-12-20 NOTE — Progress Notes (Signed)
Pre visit review using our clinic review tool, if applicable. No additional management support is needed unless otherwise documented below in the visit note. 

## 2014-12-27 ENCOUNTER — Other Ambulatory Visit (INDEPENDENT_AMBULATORY_CARE_PROVIDER_SITE_OTHER): Payer: 59

## 2014-12-27 DIAGNOSIS — Z0000 Encounter for general adult medical examination without abnormal findings: Secondary | ICD-10-CM

## 2014-12-27 LAB — COMPREHENSIVE METABOLIC PANEL
ALBUMIN: 4.4 g/dL (ref 3.5–5.2)
ALK PHOS: 17 U/L — AB (ref 39–117)
ALT: 24 U/L (ref 0–53)
AST: 22 U/L (ref 0–37)
BUN: 16 mg/dL (ref 6–23)
CO2: 30 meq/L (ref 19–32)
CREATININE: 0.87 mg/dL (ref 0.40–1.50)
Calcium: 9.4 mg/dL (ref 8.4–10.5)
Chloride: 102 mEq/L (ref 96–112)
GFR: 108.17 mL/min (ref >60.00)
Glucose, Bld: 82 mg/dL (ref 70–99)
Potassium: 4 mEq/L (ref 3.5–5.1)
SODIUM: 139 meq/L (ref 135–145)
TOTAL PROTEIN: 7.3 g/dL (ref 6.0–8.3)
Total Bilirubin: 0.5 mg/dL (ref 0.2–1.2)

## 2014-12-27 LAB — CBC WITH DIFFERENTIAL/PLATELET
BASOS PCT: 0.5 % (ref 0.0–3.0)
Basophils Absolute: 0 10*3/uL (ref 0.0–0.1)
EOS PCT: 5.8 % — AB (ref 0.0–5.0)
Eosinophils Absolute: 0.3 10*3/uL (ref 0.0–0.7)
HEMATOCRIT: 42.5 % (ref 39.0–52.0)
HEMOGLOBIN: 14.3 g/dL (ref 13.0–17.0)
LYMPHS ABS: 2 10*3/uL (ref 0.7–4.0)
Lymphocytes Relative: 35.6 % (ref 12.0–46.0)
MCHC: 33.7 g/dL (ref 30.0–36.0)
MCV: 88.1 fl (ref 78.0–100.0)
MONO ABS: 0.6 10*3/uL (ref 0.1–1.0)
Monocytes Relative: 10 % (ref 3.0–12.0)
NEUTROS PCT: 48.1 % (ref 43.0–77.0)
Neutro Abs: 2.7 10*3/uL (ref 1.4–7.7)
PLATELETS: 217 10*3/uL (ref 150.0–400.0)
RBC: 4.82 Mil/uL (ref 4.22–5.81)
RDW: 12.2 % (ref 11.5–15.5)
WBC: 5.5 10*3/uL (ref 4.0–10.5)

## 2014-12-27 LAB — LIPID PANEL
CHOL/HDL RATIO: 6
Cholesterol: 178 mg/dL (ref 0–200)
HDL: 29.8 mg/dL — ABNORMAL LOW (ref >39.00)
LDL CALC: 120 mg/dL — AB (ref 0–99)
NonHDL: 148.2
TRIGLYCERIDES: 143 mg/dL (ref 0.0–149.0)
VLDL: 28.6 mg/dL (ref 0.0–40.0)

## 2014-12-27 LAB — TSH: TSH: 0.44 u[IU]/mL (ref 0.35–4.50)

## 2014-12-27 LAB — HEMOGLOBIN A1C: Hgb A1c MFr Bld: 5.1 % (ref 4.6–6.5)

## 2015-06-16 ENCOUNTER — Encounter: Payer: Self-pay | Admitting: Nurse Practitioner

## 2015-06-16 ENCOUNTER — Ambulatory Visit (INDEPENDENT_AMBULATORY_CARE_PROVIDER_SITE_OTHER): Payer: 59 | Admitting: Nurse Practitioner

## 2015-06-16 VITALS — BP 104/74 | HR 84 | Ht 71.0 in | Wt 185.0 lb

## 2015-06-16 DIAGNOSIS — M25539 Pain in unspecified wrist: Secondary | ICD-10-CM | POA: Insufficient documentation

## 2015-06-16 DIAGNOSIS — M25531 Pain in right wrist: Secondary | ICD-10-CM | POA: Diagnosis not present

## 2015-06-16 MED ORDER — MELOXICAM 15 MG PO TABS: 15.0000 mg | ORAL_TABLET | Freq: Every day | ORAL | Status: DC

## 2015-06-16 MED FILL — MELOXICAM 15 MG TABLET: 15 | 30 days supply | Qty: 30 | Fill #0

## 2015-06-16 NOTE — Assessment & Plan Note (Signed)
Probable strain or tendinitis. New-onset Discussed treatment options with patient. We'll try Mobic once daily for 7-14 days. Advised of food on his stomach and half the pill or stop taking if stomach becomes upset. RTC if no improvement or worsening over 2 weeks. Pt verbalized understanding

## 2015-06-16 NOTE — Patient Instructions (Signed)
Mobic once daily x 7 days- if improvement you can take as needed  If some improvement- take for an additional 7 days then take as needed  If no improvement or worsening over the next 2 weeks- come back in for further evaluation.  Make sure you have something on your stomach before taking the mobic. If it hurts your stomach- half the tablet.

## 2015-06-16 NOTE — Progress Notes (Signed)
Patient ID: Keith Cummings, male    DOB: 01-17-1981  Age: 35 y.o. MRN: [id number]  CC: Wrist Pain   HPI [name] presents for right wrist pain x 5 weeks.     1) Onset- 5 weeks ago  Location- Right wrist  Duration - Intermittent, lasts a few seconds  Characteristics- sharp  Aggravating factors- Lifting, twisting  Relieving factors- Rest Severity- Mild at rest and moderate when lifting  Treatment to date:   None to date   Lifting a large piece of machinery  Moderate pain x 3 days then it subsided Did not have any trauma- just feels like he twisted it in a wrong motion  History Chantz has a past medical history of Seasonal allergies.   He has no past surgical history on file.   His family history includes Cancer in his maternal grandmother and paternal grandfather; Hyperlipidemia in his father and sister; Hypertension in his father; Mental illness in his mother. There is no history of Diabetes.He reports that he has never smoked. He has never used smokeless tobacco. He reports that he drinks alcohol. He reports that he does not use illicit drugs.  Outpatient Prescriptions Prior to Visit  Medication Sig Dispense Refill  . Multiple Vitamin (MULTIVITAMIN) tablet Take 1 tablet by mouth daily.    . Omega-3 Fatty Acids (FISH OIL) 1000 MG CAPS Take 1 capsule by mouth daily.    . fexofenadine (ALLEGRA) 180 MG tablet Take 180 mg by mouth daily. Reported on 06/16/2015     No facility-administered medications prior to visit.    ROS Review of Systems  Constitutional: Negative for fever, chills, diaphoresis and fatigue.  Musculoskeletal: Positive for myalgias. Negative for arthralgias.  Skin: Negative for color change and rash.    Objective:  BP 104/74 mmHg  Pulse 84  Ht 5\' 11"  (1.803 m)  Wt 185 lb (83.915 kg)  BMI 25.81 kg/m2  SpO2 97%  Physical Exam  Constitutional: He is oriented to person, place, and time. He appears well-developed and well-nourished. No distress.    HENT:  Head: Normocephalic and atraumatic.  Right Ear: External ear normal.  Left Ear: External ear normal.  Musculoskeletal: Normal range of motion. He exhibits no edema or tenderness.       Hands: Localized area of tenderness that patient has when lifting No obvious deformity, ecchymosis, nor loss of range of motion. Could not reproduce symptoms today  Neurological: He is alert and oriented to person, place, and time. Coordination normal.  Skin: Skin is warm and dry. No rash noted. He is not diaphoretic.  Psychiatric: He has a normal mood and affect. His behavior is normal. Judgment and thought content normal.    Assessment & Plan:   Merland was seen today for wrist pain.  Diagnoses and all orders for this visit:  Right wrist pain  Other orders -     meloxicam (MOBIC) 15 MG tablet; Take 1 tablet (15 mg total) by mouth daily.  I am having Mr. Imparato start on meloxicam. I am also having him maintain his multivitamin, fexofenadine, and Fish Oil.  Meds ordered this encounter  Medications  . meloxicam (MOBIC) 15 MG tablet    Sig: Take 1 tablet (15 mg total) by mouth daily.    Dispense:  30 tablet    Refill:  0    Order Specific Question:  Supervising Provider    Answer:  Crecencio Mc [[id number]]     Follow-up: Return if symptoms worsen or fail  to improve.

## 2015-07-01 ENCOUNTER — Telehealth: Payer: Self-pay | Admitting: *Deleted

## 2015-07-07 ENCOUNTER — Encounter: Payer: Self-pay | Admitting: Nurse Practitioner

## 2015-07-07 ENCOUNTER — Ambulatory Visit (INDEPENDENT_AMBULATORY_CARE_PROVIDER_SITE_OTHER): Payer: 59 | Admitting: Nurse Practitioner

## 2015-07-07 ENCOUNTER — Ambulatory Visit (HOSPITAL_COMMUNITY)
Admission: RE | Admit: 2015-07-07 | Discharge: 2015-07-07 | Disposition: A | Discharge status: Home / Self Care | Payer: 59 | Source: Ambulatory Visit | Attending: Nurse Practitioner | Admitting: Nurse Practitioner

## 2015-07-07 VITALS — BP 116/74 | HR 100 | Temp 97.7°F | Resp 18 | Ht 71.0 in | Wt 183.8 lb

## 2015-07-07 DIAGNOSIS — M25531 Pain in right wrist: Secondary | ICD-10-CM | POA: Insufficient documentation

## 2015-07-07 NOTE — Patient Instructions (Signed)
Tendinitis °Tendinitis is swelling and inflammation of the tendons. Tendons are band-like tissues that connect muscle to bone. Tendinitis commonly occurs in the:  °· Shoulders (rotator cuff). °· Heels (Achilles tendon). °· Elbows (triceps tendon). °CAUSES °Tendinitis is usually caused by overusing the tendon, muscles, and joints involved. When the tissue surrounding a tendon (synovium) becomes inflamed, it is called tenosynovitis. Tendinitis commonly develops in people whose jobs require repetitive motions. °SYMPTOMS °· Pain. °· Tenderness. °· Mild swelling. °DIAGNOSIS °Tendinitis is usually diagnosed by physical exam. Your health care provider may also order X-rays or other imaging tests. °TREATMENT °Your health care provider may recommend certain medicines or exercises for your treatment. °HOME CARE INSTRUCTIONS  °· Use a sling or splint for as long as directed by your health care provider until the pain decreases. °· Put ice on the injured area. °¨ Put ice in a plastic bag. °¨ Place a towel between your skin and the bag. °¨ Leave the ice on for 15-20 minutes, 3-4 times a day, or as directed by your health care provider. °· Avoid using the limb while the tendon is painful. Perform gentle range of motion exercises only as directed by your health care provider. Stop exercises if pain or discomfort increase, unless directed otherwise by your health care provider. °· Only take over-the-counter or prescription medicines for pain, discomfort, or fever as directed by your health care provider. °SEEK MEDICAL CARE IF:  °· Your pain and swelling increase. °· You develop new, unexplained symptoms, especially increased numbness in the hands. °MAKE SURE YOU:  °· Understand these instructions. °· Will watch your condition. °· Will get help right away if you are not doing well or get worse. °  °This information is not intended to replace advice given to you by your health care provider. Make sure you discuss any questions you  have with your health care provider. °  °Document Released: 05/25/2000 Document Revised: 06/18/2014 Document Reviewed: 08/14/2010 °Elsevier Interactive Patient Education ©2016 [employer] ° °

## 2015-07-07 NOTE — Progress Notes (Signed)
Patient ID: Keith Cummings, male    DOB: September 27, 1980  Age: 35 y.o. MRN: [id number]  CC: Wrist Pain   HPI WOODFORD SAULTER presents for CC of wrist pain x 2 months.   1) Mobic- two weeks, not working Lifting or certain motions causes tenderness dorsally for pt  Nothing else he has tried to date besides mobic  History Keith Cummings has a past medical history of Seasonal allergies.   He has no past surgical history on file.   His family history includes Cancer in his maternal grandmother and paternal grandfather; Hyperlipidemia in his father and sister; Hypertension in his father; Mental illness in his mother. There is no history of Diabetes.He reports that he has never smoked. He has never used smokeless tobacco. He reports that he drinks alcohol. He reports that he does not use illicit drugs.  Outpatient Prescriptions Prior to Visit  Medication Sig Dispense Refill  . Multiple Vitamin (MULTIVITAMIN) tablet Take 1 tablet by mouth daily.    . Omega-3 Fatty Acids (FISH OIL) 1000 MG CAPS Take 1 capsule by mouth daily.    . fexofenadine (ALLEGRA) 180 MG tablet Take 180 mg by mouth daily. Reported on 07/07/2015    . meloxicam (MOBIC) 15 MG tablet Take 1 tablet (15 mg total) by mouth daily. (Patient not taking: Reported on 07/07/2015) 30 tablet 0   No facility-administered medications prior to visit.    ROS Review of Systems  Constitutional: Negative for fever, chills, diaphoresis and fatigue.  Musculoskeletal: Positive for arthralgias.       Dorsal wrist tenderness on right  Skin: Negative for color change and rash.  Neurological: Negative for weakness and numbness.    Objective:  BP 116/74 mmHg  Pulse 100  Temp(Src) 97.7 F (36.5 C) (Oral)  Resp 18  Ht 5\' 11"  (1.803 m)  Wt 183 lb 12.8 oz (83.371 kg)  BMI 25.65 kg/m2  SpO2 97%  Physical Exam  Constitutional: He is oriented to person, place, and time. He appears well-developed and well-nourished. No distress.  HENT:  Head:  Normocephalic and atraumatic.  Right Ear: External ear normal.  Left Ear: External ear normal.  Cardiovascular: Normal rate, regular rhythm and normal heart sounds.  Exam reveals no gallop and no friction rub.   No murmur heard. Pulmonary/Chest: Effort normal and breath sounds normal. No respiratory distress. He has no wheezes. He has no rales. He exhibits no tenderness.  Musculoskeletal: Normal range of motion. He exhibits tenderness. He exhibits no edema.  Pt exhibits normal ROM, ROM against resistance is normal and strength 5/5 in all directions, normal grip strength, negative phalen and tinel of median nerve and ulnar nerve on right. Some pain elicited with fingers resisting adduction, but not with abduction  Neurological: He is alert and oriented to person, place, and time.  Skin: Skin is warm and dry. No rash noted. He is not diaphoretic.  Psychiatric: He has a normal mood and affect. His behavior is normal. Judgment and thought content normal.   Assessment & Plan:   Keith Cummings was seen today for wrist pain.  Diagnoses and all orders for this visit:  Right wrist pain -     DG Wrist Complete Right; Future   I have discontinued Mr. Keith Cummings's meloxicam. I am also having him maintain his multivitamin, fexofenadine, Fish Oil, and Naftifine HCl.  Meds ordered this encounter  Medications  . Naftifine HCl 2 % CREA    Sig:     Refill:  5  Follow-up: Return if symptoms worsen or fail to improve.

## 2015-07-10 NOTE — Assessment & Plan Note (Signed)
Pt still having concerns- not improved with Mobic Pt would like an x-ray- even after discussing lack of need for one Universal right wrist brace fitted and given to pt. Asked him to use at night and when lifting.  NSAIDs as needed, ice, and rest.  FU prn worsening/failure to improve.

## 2015-07-27 ENCOUNTER — Encounter: Payer: Self-pay | Admitting: Nurse Practitioner

## 2015-07-28 ENCOUNTER — Other Ambulatory Visit: Payer: Self-pay | Admitting: Nurse Practitioner

## 2015-07-28 DIAGNOSIS — M25531 Pain in right wrist: Secondary | ICD-10-CM

## 2015-08-11 ENCOUNTER — Ambulatory Visit: Payer: 59 | Admitting: Family Medicine

## 2015-08-22 ENCOUNTER — Ambulatory Visit: Payer: 59 | Admitting: Family Medicine

## 2015-08-26 ENCOUNTER — Encounter: Payer: Self-pay | Admitting: Family

## 2015-08-26 ENCOUNTER — Ambulatory Visit (INDEPENDENT_AMBULATORY_CARE_PROVIDER_SITE_OTHER): Payer: 59 | Admitting: Family

## 2015-08-26 VITALS — BP 116/70 | HR 96 | Temp 97.5°F | Resp 16 | Ht 71.0 in | Wt 187.0 lb

## 2015-08-26 DIAGNOSIS — M25531 Pain in right wrist: Secondary | ICD-10-CM

## 2015-08-26 MED ORDER — DICLOFENAC SODIUM 2 % TD SOLN: 1.0000 "application " | Freq: Two times a day (BID) | TRANSDERMAL | Status: DC | PRN

## 2015-08-26 NOTE — Progress Notes (Signed)
Pre visit review using our clinic review tool, if applicable. No additional management support is needed unless otherwise documented below in the visit note. 

## 2015-08-26 NOTE — Progress Notes (Signed)
Subjective:    Patient ID: Keith Cummings, male    DOB: 12/16/1980, 35 y.o.   MRN: [id number]  Chief Complaint  Patient presents with  . Wrist Pain    3 months ago he turned his wrist holding something heavy and it was sore for a while, now certain positions cause it to hurt    HPI:  Keith Cummings is a 35 y.o. male who  has a past medical history of Seasonal allergies. and presents today For a follow-up office visit with sports medicine.  Previously seen in primary care for right wrist pain that has been going on for about 3 months now. Following lifting a trailer with his right hand was pronated. Denies any sounds/sensations following the initial event.  Pain is located on the top of his wrist and is described as sharp with twisting motion and is relieved once the motion is stopped. This has been refractory to anti-inflammatories and bracing. No numbness, tingling or weakness.   No Known Allergies   Current Outpatient Prescriptions on File Prior to Visit  Medication Sig Dispense Refill  . fexofenadine (ALLEGRA) 180 MG tablet Take 180 mg by mouth daily. Reported on 07/07/2015    . Multiple Vitamin (MULTIVITAMIN) tablet Take 1 tablet by mouth daily.     No current facility-administered medications on file prior to visit.    Review of Systems  Musculoskeletal:       Positive for right wrist pain.  Neurological: Negative for weakness and numbness.      Objective:    BP 116/70 mmHg  Pulse 96  Temp(Src) 97.5 F (36.4 C) (Oral)  Resp 16  Ht 5\' 11"  (1.803 m)  Wt 187 lb (84.823 kg)  BMI 26.09 kg/m2  SpO2 97% Nursing note and vital signs reviewed.  Physical Exam  Constitutional: He is oriented to person, place, and time. He appears well-developed and well-nourished. No distress.  Cardiovascular: Normal rate, regular rhythm, normal heart sounds and intact distal pulses.   Pulmonary/Chest: Effort normal and breath sounds normal.  Musculoskeletal:  Right wrist - no  obvious deformity, discoloration, or edema noted. Mild tenderness elicited over extensor carpi radialis longus and brevis. Range of motion was within normal limits and strength is 5+ throughout. Pulses are intact and appropriate. Negative valgus/varus. Negative Finkelstein's.  Neurological: He is alert and oriented to person, place, and time.  Skin: Skin is warm and dry.  Psychiatric: He has a normal mood and affect. His behavior is normal. Judgment and thought content normal.   Examination: Ultrasound of the wrist Date:  08/26/2015  History: 3 month history of wrist pain after lifting something; pain mainly with pronation and twisting. Findings:  All extensor compartments visualized and tendons with normal appearance without fraying, tears, or sheath effusion. TFCC and scapholunate ligament intact. No dorsal or volar ganglion cyst. Additional focused exam at the site of symptoms was unrevealing. Impression:  Unremarkable ultrasound of the wrist.        Ordered, performed and interpreted by Terri Piedra, FNP-C All images are located under the media tab.      Assessment & Plan:   Problem List Items Addressed This Visit      Other   Right wrist pain - Primary    Wrist exam and ultasound with no significant findings with potential wrist sprain as musculature appears intact and strength is good. Continue conservative treatment with ice, home exercise therapy and bracing. Start Pennsaid.  Follow up in 2 weeks if symptoms  do not improve for further imaging or possible injection.       Relevant Medications   Diclofenac Sodium (PENNSAID) 2 % SOLN   Other Relevant Orders   [place] Extrem Up Right Ltd

## 2015-08-26 NOTE — Patient Instructions (Signed)
Thank you for choosing [facility].  Summary/Instructions:   Continue to ice 2-3 times per day and after activity as needed. Pennsaid - 2x per day about pinkie sized dose. Brace as needed.  Follow up in 2 weeks if not improving.  Exercises daily.  If your symptoms worsen or fail to improve, please contact our office for further instruction, or in case of emergency go directly to the emergency room at the closest medical facility.    Generic Wrist Exercises RANGE OF MOTION (ROM) AND STRETCHING EXERCISES - Wrist Sprain  These exercises may help you when beginning to rehabilitate your injury. Your symptoms may resolve with or without further involvement from your physician, physical therapist, or athletic trainer. While completing these exercises, remember:   Restoring tissue flexibility helps normal motion to return to the joints. This allows healthier, less painful movement and activity.  An effective stretch should be held for at least 30 seconds.  A stretch should never be painful. You should only feel a gentle lengthening or release in the stretched tissue. RANGE OF MOTION - Wrist Flexion, Active-Assisted  Extend your right / left elbow with your palm pointing down.*  Gently pull the back of your hand toward you until you feel a gentle stretch on the top of your forearm.  Hold this position for __________ seconds. Repeat __________ times. Complete this exercise __________ times per day.  *If directed by your physician, physical therapist, or athletic trainer, complete this stretch with your elbow bent rather than extended. RANGE OF MOTION - Wrist Extension, Active-Assisted   Extend your right / left elbow and turn your palm upward.*  Gently pull your palm/fingertips back so your wrist extends and your fingers point more toward the ground.  You should feel a gentle stretch on the inside of your forearm.  Hold this position for __________ seconds. Repeat __________  times. Complete this exercise __________ times per day. *If directed by your physician, physical therapist, or athletic trainer, complete this stretch with your elbow bent, rather than extended. RANGE OF MOTION - Supination, Active   Stand or sit with your elbows at your side. Bend your right / left elbow to 90 degrees.  Turn your palm upward until you feel a gentle stretch on the inside of your forearm.  Hold this position for __________ seconds. Slowly release and return to the starting position. Repeat __________ times. Complete this stretch __________ times per day.  RANGE OF MOTION - Pronation, Active   Stand or sit with your elbows at your side. Bend your right / left elbow to 90 degrees.  Turn your palm downward until you feel a gentle stretch on the top of your forearm.  Hold this position for __________ seconds. Slowly release and return to the starting position. Repeat __________ times. Complete this stretch __________ times per day.  STRENGTHENING EXERCISES  These exercises may help you when beginning to rehabilitate your injury. They may resolve your symptoms with or without further involvement from your physician, physical therapist, or athletic trainer. While completing these exercises, remember:   Muscles can gain both the endurance and the strength needed for everyday activities through controlled exercises.  Complete these exercises as instructed by your physician, physical therapist, or athletic trainer. Progress the resistance and repetitions only as guided.  You may experience muscle soreness or fatigue, but the pain or discomfort you are trying to eliminate should never worsen during these exercises. If this pain does worsen, stop and make certain you are following  the directions exactly. If the pain is still present after adjustments, discontinue the exercise until you can discuss the trouble with your clinician. STRENGTH - Wrist Flexors  Sit with your right / left  forearm palm-up and fully supported. Your elbow should be resting below the height of your shoulder. Allow your wrist to extend over the edge of the surface.  Loosely holding a __________ weight or a piece of rubber exercise band/tubing, slowly curl your hand up toward your forearm.  Hold this position for __________ seconds. Slowly lower the wrist back to the starting position in a controlled manner. Repeat __________ times. Complete this exercise __________ times per day.  STRENGTH - Wrist Extensors  Sit with your right / left forearm palm-down and fully supported. Your elbow should be resting below the height of your shoulder. Allow your wrist to extend over the edge of the surface.  Loosely holding a __________ weight or a piece of rubber exercise band/tubing, slowly curl your hand up toward your forearm.  Hold this position for __________ seconds. Slowly lower the wrist back to the starting position in a controlled manner. Repeat __________ times. Complete this exercise __________ times per day.  STRENGTH - Forearm Supinators  Sit with your right / left forearm supported on a table, keeping your elbow below shoulder height. Rest your hand over the edge, palm down.  Gently grip a hammer or a soup ladle.  Without moving your elbow, slowly turn your palm and hand upward to a "thumbs-up" position.  Hold this position for __________ seconds. Slowly return to the starting position. Repeat __________ times. Complete this exercise __________ times per day.  STRENGTH - Forearm Pronators   Sit with your right / left forearm supported on a table, keeping your elbow below shoulder height. Rest your hand over the edge, palm up.  Gently grip a hammer or a soup ladle.  Without moving your elbow, slowly turn your palm and hand upward to a "thumbs-up" position.  Hold this position for __________ seconds. Slowly return to the starting position. Repeat __________ times. Complete this exercise  __________ times per day.  STRENGTH - Grip  Grasp a tennis ball, a dense sponge, or a large, rolled sock in your hand.  Squeeze as hard as you can without increasing any pain.  Hold this position for __________ seconds. Release your grip slowly. Repeat __________ times. Complete this exercise __________ times per day.    This information is not intended to replace advice given to you by your health care provider. Make sure you discuss any questions you have with your health care provider.   Document Released: 04/11/2005 Document Revised: 06/18/2014 Document Reviewed: 09/09/2008 Elsevier Interactive Patient Education [employer].

## 2015-08-26 NOTE — Assessment & Plan Note (Signed)
Wrist exam and ultasound with no significant findings with potential wrist sprain as musculature appears intact and strength is good. Continue conservative treatment with ice, home exercise therapy and bracing. Start Pennsaid.  Follow up in 2 weeks if symptoms do not improve for further imaging or possible injection.

## 2015-10-17 DIAGNOSIS — D485 Neoplasm of uncertain behavior of skin: Secondary | ICD-10-CM | POA: Diagnosis not present

## 2015-10-17 DIAGNOSIS — Z1283 Encounter for screening for malignant neoplasm of skin: Secondary | ICD-10-CM | POA: Diagnosis not present

## 2015-10-17 DIAGNOSIS — L309 Dermatitis, unspecified: Secondary | ICD-10-CM | POA: Diagnosis not present

## 2015-10-17 DIAGNOSIS — D229 Melanocytic nevi, unspecified: Secondary | ICD-10-CM | POA: Diagnosis not present

## 2015-12-23 ENCOUNTER — Encounter: Payer: 59 | Admitting: Nurse Practitioner

## 2016-01-02 ENCOUNTER — Encounter: Payer: Self-pay | Admitting: Family Medicine

## 2016-01-02 ENCOUNTER — Ambulatory Visit (INDEPENDENT_AMBULATORY_CARE_PROVIDER_SITE_OTHER): Payer: 59 | Admitting: Family Medicine

## 2016-01-02 ENCOUNTER — Encounter: Payer: 59 | Admitting: Nurse Practitioner

## 2016-01-02 DIAGNOSIS — Z0000 Encounter for general adult medical examination without abnormal findings: Secondary | ICD-10-CM | POA: Insufficient documentation

## 2016-01-02 DIAGNOSIS — Z0001 Encounter for general adult medical examination with abnormal findings: Secondary | ICD-10-CM | POA: Diagnosis not present

## 2016-01-02 DIAGNOSIS — R6889 Other general symptoms and signs: Secondary | ICD-10-CM | POA: Diagnosis not present

## 2016-01-02 DIAGNOSIS — R55 Syncope and collapse: Secondary | ICD-10-CM | POA: Insufficient documentation

## 2016-01-02 NOTE — Progress Notes (Signed)
Subjective:  Patient ID: Keith Cummings, male    DOB: [birth year]  Age: 35 y.o. MRN: [id number]  CC: Annual physical  HPI Keith Cummings is a 35 y.o. male presents to the clinic today for an annual physical. Also has complaints of dizziness.  Preventative Healthcare  Immunizations  Tetanus - Up to date.  Pneumococcal - Not indicated.   Labs: Will discuss labs today.  Alcohol use: Occasional.  Smoking/tobacco use: Nonsmoker.  STD/HIV testing: Declines.  Dizziness  Patient reports that he's had several episodes of dizziness.  Most recently approximately one week ago he had an episode.  He states that it occurred after he was mowing grass.   Resolved after rest.  He's had other instances where he has had dizziness and feelings like he is going to pass out. They're all preceded by vigorous physical activity.  All relieved by rest.  No other associated symptoms.  PMH, Surgical Hx, Family Hx, Social History reviewed and updated as below.  Past Medical History:  Diagnosis Date  . Seasonal allergies    Past Surgical History:  Procedure Laterality Date  . NO PAST SURGERIES      Family History  Problem Relation Age of Onset  . Mental illness Mother   . Hyperlipidemia Father   . Hypertension Father   . Hyperlipidemia Sister   . Cancer Maternal Grandmother     uterus  . Cancer Paternal Grandfather     prostate, bone and lung cancer  . Diabetes Neg Hx    Social History  Substance Use Topics  . Smoking status: Never Smoker  . Smokeless tobacco: Never Used  . Alcohol use 0.0 oz/week     Comment: Does not drink regularly. Just has occasional beer possibly every couple of months.   Review of Systems  Neurological: Positive for dizziness.  All other systems reviewed and are negative.  Objective:   Today's Vitals: BP 110/82 (BP Location: Left Arm, Patient Position: Sitting, Cuff Size: Normal)   Pulse 78   Temp 97.6 F (36.4 C) (Oral)   Ht 5' 11.75"  (1.822 m)   Wt 193 lb 4 oz (87.7 kg)   SpO2 98%   BMI 26.39 kg/m   Physical Exam  Constitutional: He is oriented to person, place, and time. He appears well-developed and well-nourished. No distress.  HENT:  Head: Normocephalic and atraumatic.  Nose: Nose normal.  Mouth/Throat: Oropharynx is clear and moist. No oropharyngeal exudate.  Normal TM's bilaterally.   Eyes: Conjunctivae are normal. No scleral icterus.  Neck: Neck supple. No thyromegaly present.  Cardiovascular: Normal rate and regular rhythm.   No murmur heard. Pulmonary/Chest: Effort normal and breath sounds normal. He has no wheezes. He has no rales.  Abdominal: Soft. He exhibits no distension. There is no tenderness. There is no rebound and no guarding.  Musculoskeletal: Normal range of motion. He exhibits no edema.  Lymphadenopathy:    He has no cervical adenopathy.  Neurological: He is alert and oriented to person, place, and time.  Skin: Skin is warm and dry. No rash noted.  Psychiatric: He has a normal mood and affect.  Vitals reviewed.  Assessment & Plan:   Problem List Items Addressed This Visit    Encounter for preventative adult health care exam with abnormal findings    Preventative health care up to date. Holding off of labs today.      Pre-syncope    New problem. Unclear etiology/prognosis. Favor benign etiology given age and lack of  other symptoms. Exam unremarkable. Advised supportive care, hydration. If recurs will send to cardiology for eval.       Other Visit Diagnoses   None.     Outpatient Encounter Prescriptions as of 01/02/2016  Medication Sig  . [DISCONTINUED] Diclofenac Sodium (PENNSAID) 2 % SOLN Place 1 application onto the skin 2 (two) times daily as needed.  . [DISCONTINUED] fexofenadine (ALLEGRA) 180 MG tablet Take 180 mg by mouth daily. Reported on 07/07/2015  . [DISCONTINUED] Multiple Vitamin (MULTIVITAMIN) tablet Take 1 tablet by mouth daily.   No facility-administered  encounter medications on file as of 01/02/2016.    Follow-up: Annually  [place]

## 2016-01-02 NOTE — Assessment & Plan Note (Signed)
Preventative health care up to date. Holding off of labs today.

## 2016-01-02 NOTE — Assessment & Plan Note (Addendum)
New problem. Unclear etiology/prognosis. Favor benign etiology given age and lack of other symptoms. Exam unremarkable. Advised supportive care, hydration. If recurs will send to cardiology for eval.

## 2016-01-02 NOTE — Patient Instructions (Signed)
Call if you would like labs.  Stay well hydrated.   If it persists let me know.  Follow up annually  Take care  Dr. Lacinda Axon

## 2016-03-23 ENCOUNTER — Encounter: Payer: Self-pay | Admitting: Family Medicine

## 2016-03-28 ENCOUNTER — Ambulatory Visit (INDEPENDENT_AMBULATORY_CARE_PROVIDER_SITE_OTHER): Payer: 59 | Admitting: Family Medicine

## 2016-03-28 ENCOUNTER — Encounter: Payer: Self-pay | Admitting: Family Medicine

## 2016-03-28 DIAGNOSIS — L509 Urticaria, unspecified: Secondary | ICD-10-CM | POA: Diagnosis not present

## 2016-03-28 MED ORDER — METHYLPREDNISOLONE ACETATE 80 MG/ML IJ SUSP
80.0000 mg | Freq: Once | INTRAMUSCULAR | Status: AC
  Administered 2016-03-28: 80 mg via INTRAMUSCULAR

## 2016-03-28 MED ORDER — PREDNISONE 10 MG (48) PO TBPK: ORAL_TABLET | ORAL | 0 refills | Status: DC

## 2016-03-28 MED FILL — predniSONE 10 MG (48) TBPK: 10 | 12 days supply | Qty: 48 | Fill #0

## 2016-03-28 NOTE — Patient Instructions (Signed)
Take the steroid per the package instructions.  Benadryl as needed.  Call if you worsen or fail to improve.  Take care  Dr. Lacinda Axon

## 2016-03-28 NOTE — Progress Notes (Signed)
Pre visit review using our clinic review tool, if applicable. No additional management support is needed unless otherwise documented below in the visit note. 

## 2016-03-28 NOTE — Assessment & Plan Note (Signed)
New problem. Exam appears to be consistent with urticaria. Treating with IM injection of Depo-Medrol 80 mg. Patient to start prednisone taper tomorrow. Benadryl as needed.

## 2016-03-28 NOTE — Progress Notes (Signed)
Subjective:  Patient ID: Keith Cummings, male    DOB: 1980/11/25  Age: 35 y.o. MRN: [id number]  CC: Rash  HPI:  35 year old male presents for an acute visit with complaints of rash.  Patient states that on Monday night he developed a diffuse red rash. He reports associated itching. He states that it started after going to a banquet. He states that he ate some foods he's never eaten before. He thinks this may be the culprit. No other new exposures or changes. He is taking Benadryl with improvement. He states that after the Benadryl wears off, he has a recurrence and worsening of the rash. No associated shortness of breath or tightness in throat. No other associated symptoms. No other complaints at this time.  Social Hx   Social History   Social History  . Marital status: Married    Spouse name: N/A  . Number of children: N/A  . Years of education: N/A   Social History Main Topics  . Smoking status: Never Smoker  . Smokeless tobacco: Never Used  . Alcohol use 0.0 oz/week     Comment: Does not drink regularly. Just has occasional beer possibly every couple of months.  . Drug use: No  . Sexual activity: Yes    Partners: Female     Comment: Wife   Other Topics Concern  . None   Social History Narrative   Works at [employer] in [place] with wife and no children    Pets: 1 dog and 1 snake    Caffeine- 1-2 cups of coffee/soda/tea daily    Enjoys movies, computer games, reading    Review of Systems  Constitutional: Negative.   Skin: Positive for rash.   Objective:  BP 120/82 (BP Location: Right Arm, Patient Position: Sitting, Cuff Size: Normal)   Pulse 77   Temp 97.5 F (36.4 C) (Oral)   Wt 184 lb 8 oz (83.7 kg)   SpO2 97%   BMI 25.20 kg/m   BP/Weight 03/28/2016 01/02/2016 [id number]  Systolic BP [id number] [id number]  Diastolic BP 82 82 70  Wt. (Lbs) 184.5 193.25 187  BMI 25.2 26.39 26.09   Physical Exam  Constitutional: He is oriented to person, place,  and time. He appears well-developed. No distress.  Pulmonary/Chest: Effort normal.  Neurological: He is alert and oriented to person, place, and time.  Skin:  Diffuse, erythematous rash noted. Quickly worse on the arms/hands and trunk. See pictures below.  Psychiatric: He has a normal mood and affect.  Vitals reviewed.      Lab Results  Component Value Date   WBC 5.5 12/27/2014   HGB 14.3 12/27/2014   HCT 42.5 12/27/2014   PLT 217.0 12/27/2014   GLUCOSE 82 12/27/2014   CHOL 178 12/27/2014   TRIG 143.0 12/27/2014   HDL 29.80 (L) 12/27/2014   LDLDIRECT 149.8 08/05/2012   LDLCALC 120 (H) 12/27/2014   ALT 24 12/27/2014   AST 22 12/27/2014   NA 139 12/27/2014   K 4.0 12/27/2014   CL 102 12/27/2014   CREATININE 0.87 12/27/2014   BUN 16 12/27/2014   CO2 30 12/27/2014   TSH 0.44 12/27/2014   HGBA1C 5.1 12/27/2014    Assessment & Plan:   Problem List Items Addressed This Visit    Urticaria    New problem. Exam appears to be consistent with urticaria. Treating with IM injection of Depo-Medrol 80 mg. Patient to start prednisone taper tomorrow. Benadryl as needed.  Relevant Medications   methylPREDNISolone acetate (DEPO-MEDROL) injection 80 mg (Completed)    Other Visit Diagnoses   None.    Meds ordered this encounter  Medications  . predniSONE (STERAPRED UNI-PAK 48 TAB) 10 MG (48) TBPK tablet    Sig: Per Package instructions.    Dispense:  48 tablet    Refill:  0  . methylPREDNISolone acetate (DEPO-MEDROL) injection 80 mg   Follow-up: PRN  [place]

## 2016-03-31 ENCOUNTER — Encounter: Payer: Self-pay | Admitting: Family Medicine

## 2016-04-02 MED FILL — NAFTIFINE HCL 2% CREAM: 2 | 15 days supply | Qty: 45 | Fill #0

## 2016-04-30 DIAGNOSIS — B353 Tinea pedis: Secondary | ICD-10-CM | POA: Diagnosis not present

## 2016-04-30 DIAGNOSIS — D229 Melanocytic nevi, unspecified: Secondary | ICD-10-CM | POA: Diagnosis not present

## 2016-04-30 DIAGNOSIS — L578 Other skin changes due to chronic exposure to nonionizing radiation: Secondary | ICD-10-CM | POA: Diagnosis not present

## 2016-04-30 DIAGNOSIS — Z1283 Encounter for screening for malignant neoplasm of skin: Secondary | ICD-10-CM | POA: Diagnosis not present

## 2016-04-30 DIAGNOSIS — D1800 Hemangioma unspecified site: Secondary | ICD-10-CM | POA: Diagnosis not present

## 2016-04-30 DIAGNOSIS — D485 Neoplasm of uncertain behavior of skin: Secondary | ICD-10-CM | POA: Diagnosis not present

## 2016-04-30 DIAGNOSIS — L280 Lichen simplex chronicus: Secondary | ICD-10-CM | POA: Diagnosis not present

## 2016-04-30 DIAGNOSIS — L812 Freckles: Secondary | ICD-10-CM | POA: Diagnosis not present

## 2016-04-30 DIAGNOSIS — Z808 Family history of malignant neoplasm of other organs or systems: Secondary | ICD-10-CM | POA: Diagnosis not present

## 2016-11-17 IMAGING — CR DG WRIST COMPLETE 3+V*R*
4 series · 4 of 4 positions shown · non-contrast
Comparison: None in PACs

CLINICAL DATA: Posterior right wrist pain for the past 2 months due
to heavy lifting with increasing symptoms over the past 2 weeks. No
paresthesias.

EXAM:
RIGHT WRIST - COMPLETE 3+ VIEW

[wrist pa]
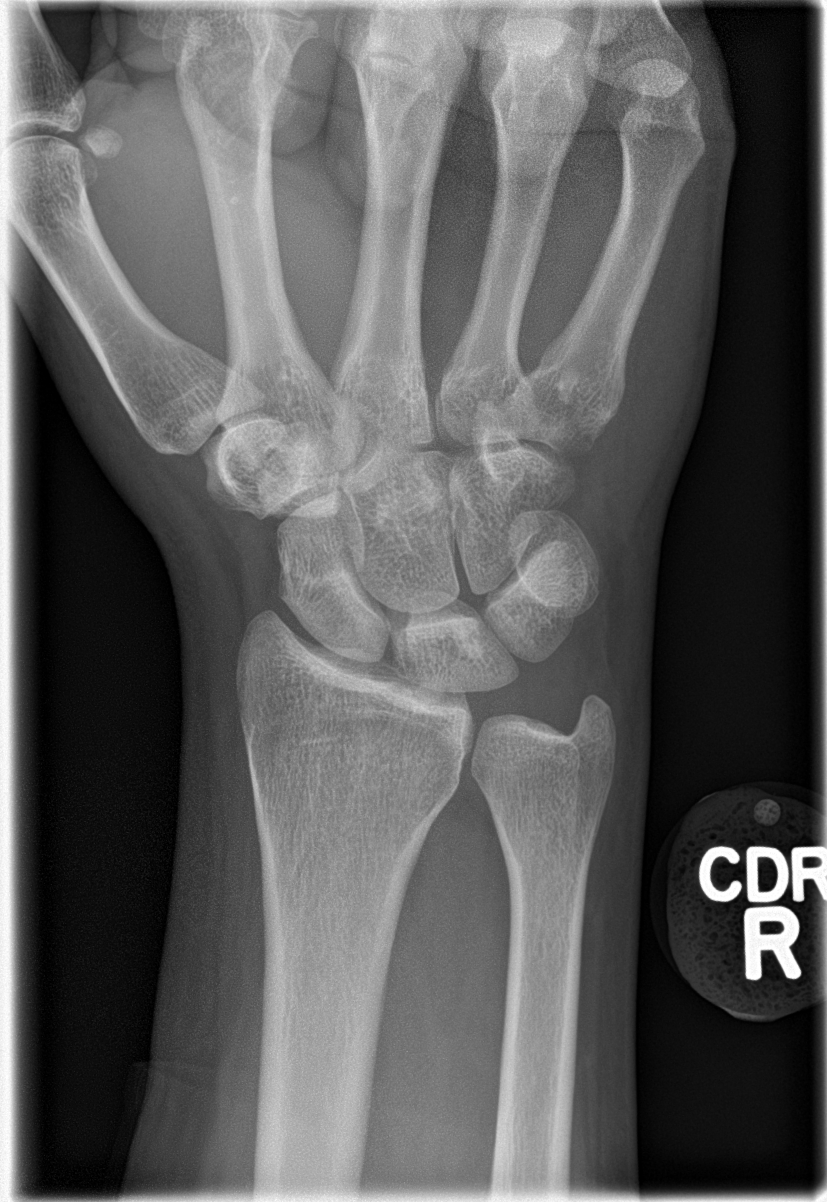

[wrist obl]
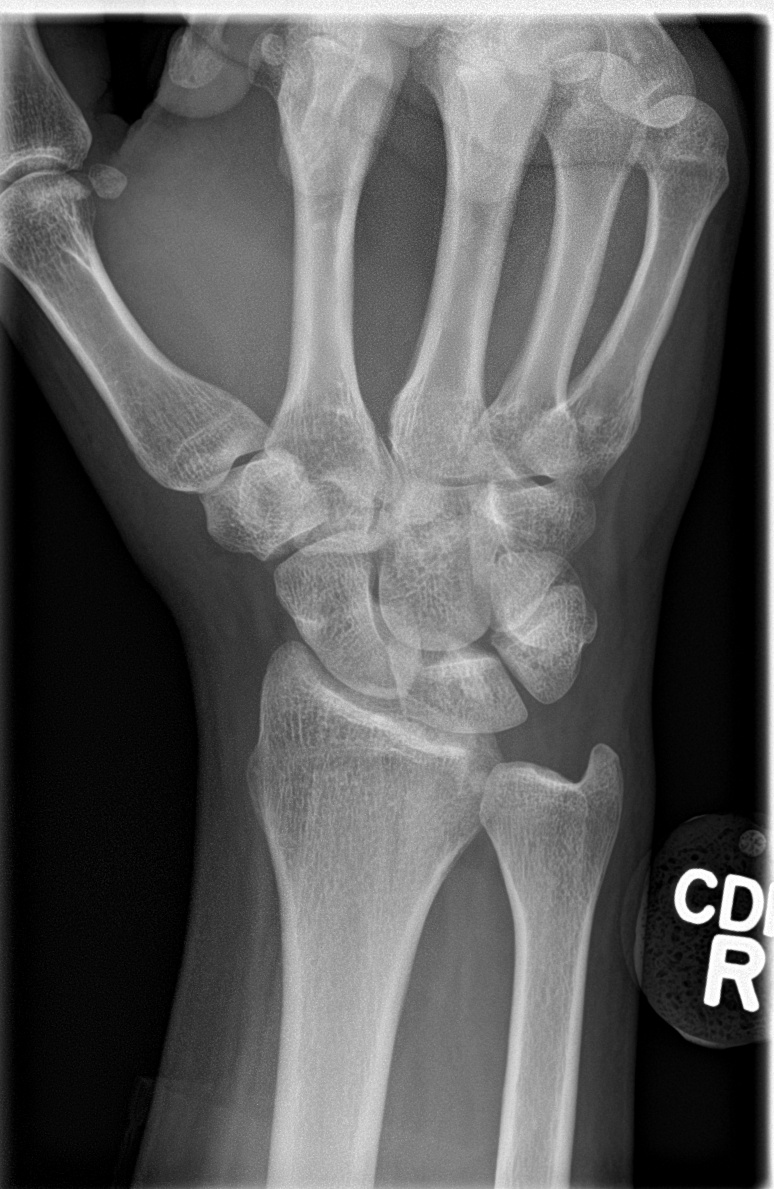

[wrist lat]
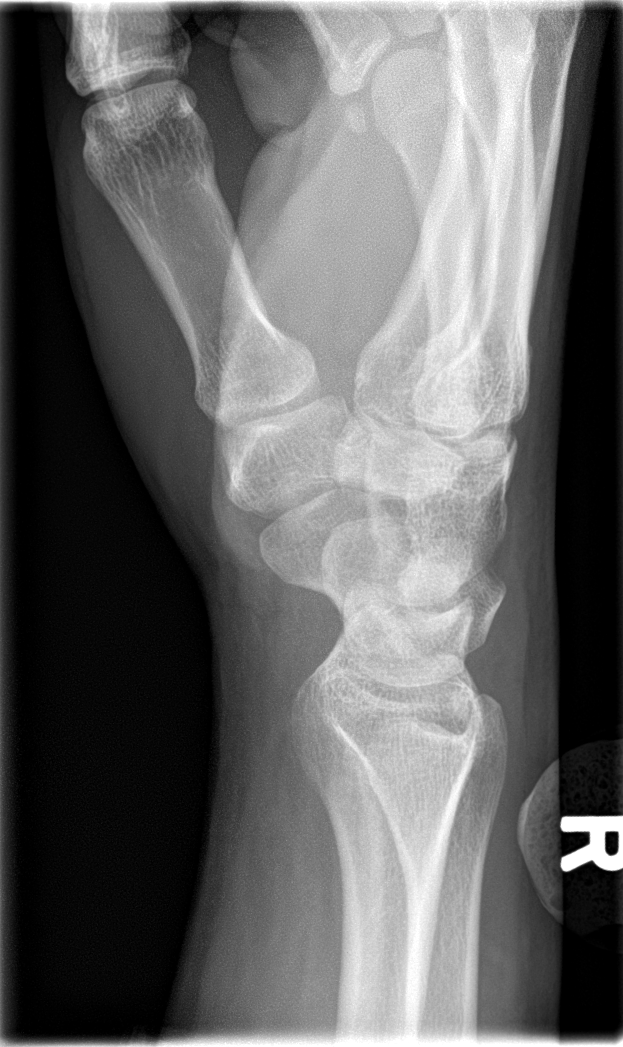

[wrist navicular]
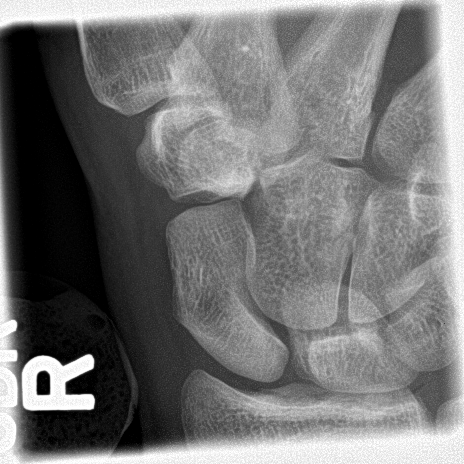

[4 of 4 positions shown; findings below may reference images not displayed]

FINDINGS: The bones of the right wrist are adequately mineralized. The joint
spaces are preserved. There is no acute fracture nor dislocation.
There is no significant degenerative change. Specific attention to
the scaphoid reveals no acute abnormality. The soft tissues are
unremarkable.
IMPRESSION: There is no acute or significant chronic bony abnormality of the
right wrist.

## 2016-12-13 ENCOUNTER — Ambulatory Visit (INDEPENDENT_AMBULATORY_CARE_PROVIDER_SITE_OTHER): Payer: 59 | Admitting: Family Medicine

## 2016-12-13 DIAGNOSIS — J209 Acute bronchitis, unspecified: Secondary | ICD-10-CM | POA: Diagnosis not present

## 2016-12-13 MED ORDER — DOXYCYCLINE HYCLATE 100 MG PO TABS: 100.0000 mg | ORAL_TABLET | Freq: Two times a day (BID) | ORAL | 0 refills | Status: DC

## 2016-12-13 MED FILL — DOXYCYCLINE HYCLATE 100 MG: 100 | 7 days supply | Qty: 14 | Fill #0

## 2016-12-13 NOTE — Assessment & Plan Note (Signed)
New problem. Given duration of symptoms/illness, treating with doxycycline.

## 2016-12-13 NOTE — Patient Instructions (Signed)
Antibiotic as prescribed.  Take care  Dr. Lacinda Axon   Acute Bronchitis, Adult Acute bronchitis is sudden (acute) swelling of the air tubes (bronchi) in the lungs. Acute bronchitis causes these tubes to fill with mucus, which can make it hard to breathe. It can also cause coughing or wheezing. In adults, acute bronchitis usually goes away within 2 weeks. A cough caused by bronchitis may last up to 3 weeks. Smoking, allergies, and asthma can make the condition worse. Repeated episodes of bronchitis may cause further lung problems, such as chronic obstructive pulmonary disease (COPD). What are the causes? This condition can be caused by germs and by substances that irritate the lungs, including:  Cold and flu viruses. This condition is most often caused by the same virus that causes a cold.  Bacteria.  Exposure to tobacco smoke, dust, fumes, and air pollution.  What increases the risk? This condition is more likely to develop in people who:  Have close contact with someone with acute bronchitis.  Are exposed to lung irritants, such as tobacco smoke, dust, fumes, and vapors.  Have a weak immune system.  Have a respiratory condition such as asthma.  What are the signs or symptoms? Symptoms of this condition include:  A cough.  Coughing up clear, yellow, or green mucus.  Wheezing.  Chest congestion.  Shortness of breath.  A fever.  Body aches.  Chills.  A sore throat.  How is this diagnosed? This condition is usually diagnosed with a physical exam. During the exam, your health care provider may order tests, such as chest X-rays, to rule out other conditions. He or she may also:  Test a sample of your mucus for bacterial infection.  Check the level of oxygen in your blood. This is done to check for pneumonia.  Do a chest X-ray or lung function testing to rule out pneumonia and other conditions.  Perform blood tests.  Your health care provider will also ask about your  symptoms and medical history. How is this treated? Most cases of acute bronchitis clear up over time without treatment. Your health care provider may recommend:  Drinking more fluids. Drinking more makes your mucus thinner, which may make it easier to breathe.  Taking a medicine for a fever or cough.  Taking an antibiotic medicine.  Using an inhaler to help improve shortness of breath and to control a cough.  Using a cool mist vaporizer or humidifier to make it easier to breathe.  Follow these instructions at home: Medicines  Take over-the-counter and prescription medicines only as told by your health care provider.  If you were prescribed an antibiotic, take it as told by your health care provider. Do not stop taking the antibiotic even if you start to feel better. General instructions  Get plenty of rest.  Drink enough fluids to keep your urine clear or pale yellow.  Avoid smoking and secondhand smoke. Exposure to cigarette smoke or irritating chemicals will make bronchitis worse. If you smoke and you need help quitting, ask your health care provider. Quitting smoking will help your lungs heal faster.  Use an inhaler, cool mist vaporizer, or humidifier as told by your health care provider.  Keep all follow-up visits as told by your health care provider. This is important. How is this prevented? To lower your risk of getting this condition again:  Wash your hands often with soap and water. If soap and water are not available, use hand sanitizer.  Avoid contact with people who have  cold symptoms.  Try not to touch your hands to your mouth, nose, or eyes.  Make sure to get the flu shot every year.  Contact a health care provider if:  Your symptoms do not improve in 2 weeks of treatment. Get help right away if:  You cough up blood.  You have chest pain.  You have severe shortness of breath.  You become dehydrated.  You faint or keep feeling like you are going to  faint.  You keep vomiting.  You have a severe headache.  Your fever or chills gets worse. This information is not intended to replace advice given to you by your health care provider. Make sure you discuss any questions you have with your health care provider. Document Released: 07/05/2004 Document Revised: 12/21/2015 Document Reviewed: 11/16/2015 Elsevier Interactive Patient Education  2017 [employer].

## 2016-12-13 NOTE — Progress Notes (Signed)
Subjective:  Patient ID: Keith Cummings, male    DOB: 1981-02-28  Age: 36 y.o. MRN: [id number]  CC: Cough  HPI:  36 year old male presents with complaints of cough.  Cough  X 2-3 months.  Productive of yellow sputum.  Moderate in severity.  He reports some associated sneezing due to allergies but no real other associated symptoms.  Use Mucinex early on but had no improvement. Next  No associated fever or shortness of breath.  No known exacerbating or relieving factors.  No other associated symptoms. No other complaints at this time.  Social Hx   Social History   Social History  . Marital status: Married    Spouse name: N/A  . Number of children: N/A  . Years of education: N/A   Social History Main Topics  . Smoking status: Never Smoker  . Smokeless tobacco: Never Used  . Alcohol use 0.0 oz/week     Comment: Does not drink regularly. Just has occasional beer possibly every couple of months.  . Drug use: No  . Sexual activity: Yes    Partners: Female     Comment: Wife   Other Topics Concern  . Not on file   Social History Narrative   Works at [employer] in [place] with wife and no children    Pets: 1 dog and 1 snake    Caffeine- 1-2 cups of coffee/soda/tea daily    Enjoys movies, computer games, reading     Review of Systems  Constitutional: Negative for fever.  Respiratory: Positive for cough. Negative for shortness of breath.    Objective:  BP 100/66 (BP Location: Left Arm, Patient Position: Sitting, Cuff Size: Normal)   Pulse 67   Temp 98.4 F (36.9 C) (Oral)   Resp 12   Wt 181 lb 4 oz (82.2 kg)   SpO2 99%   BMI 24.75 kg/m   BP/Weight 12/13/2016 03/28/2016 9/76/7341  Systolic BP 937 902 409  Diastolic BP 66 82 82  Wt. (Lbs) 181.25 184.5 193.25  BMI 24.75 25.2 26.39    Physical Exam  Constitutional: He is oriented to person, place, and time. He appears well-developed. No distress.  HENT:  Head: Normocephalic and  atraumatic.  Mouth/Throat: Oropharynx is clear and moist.  Normal TM's bilaterally.  Cardiovascular: Normal rate and regular rhythm.   No murmur heard. Pulmonary/Chest: Effort normal. He has no wheezes. He has no rales.  Neurological: He is alert and oriented to person, place, and time.  Psychiatric: He has a normal mood and affect.  Vitals reviewed.   Lab Results  Component Value Date   WBC 5.5 12/27/2014   HGB 14.3 12/27/2014   HCT 42.5 12/27/2014   PLT 217.0 12/27/2014   GLUCOSE 82 12/27/2014   CHOL 178 12/27/2014   TRIG 143.0 12/27/2014   HDL 29.80 (L) 12/27/2014   LDLDIRECT 149.8 08/05/2012   LDLCALC 120 (H) 12/27/2014   ALT 24 12/27/2014   AST 22 12/27/2014   NA 139 12/27/2014   K 4.0 12/27/2014   CL 102 12/27/2014   CREATININE 0.87 12/27/2014   BUN 16 12/27/2014   CO2 30 12/27/2014   TSH 0.44 12/27/2014   HGBA1C 5.1 12/27/2014    Assessment & Plan:   Problem List Items Addressed This Visit      Respiratory   Subacute bronchitis    New problem. Given duration of symptoms/illness, treating with doxycycline.         Meds  ordered this encounter  Medications  . doxycycline (VIBRA-TABS) 100 MG tablet    Sig: Take 1 tablet (100 mg total) by mouth 2 (two) times daily.    Dispense:  14 tablet    Refill:  0     Follow-up: PRN  [place]

## 2016-12-14 ENCOUNTER — Telehealth: Payer: Self-pay | Admitting: Family Medicine

## 2016-12-14 NOTE — Telephone Encounter (Signed)
Pt dropped off [facility] medical evaluation to be filled out. Placed in Dr. Geralynn Ochs colored folder upfront.

## 2016-12-17 NOTE — Telephone Encounter (Signed)
Last office visit 03/31/16 acute Last physical 01/02/16 form placed in your folder  Does patient need appointment

## 2016-12-17 NOTE — Telephone Encounter (Signed)
Done; placed in bin.

## 2016-12-17 NOTE — Telephone Encounter (Signed)
Patient notified form placed up front.

## 2017-03-14 ENCOUNTER — Encounter: Payer: Self-pay | Admitting: Family Medicine

## 2017-03-26 ENCOUNTER — Ambulatory Visit (INDEPENDENT_AMBULATORY_CARE_PROVIDER_SITE_OTHER): Payer: 59 | Admitting: Family Medicine

## 2017-03-26 ENCOUNTER — Encounter: Payer: Self-pay | Admitting: Family Medicine

## 2017-03-26 VITALS — BP 138/82 | HR 78 | Temp 98.8°F | Ht 70.0 in | Wt 186.6 lb

## 2017-03-26 DIAGNOSIS — M25531 Pain in right wrist: Secondary | ICD-10-CM

## 2017-03-26 DIAGNOSIS — E663 Overweight: Secondary | ICD-10-CM | POA: Diagnosis not present

## 2017-03-26 DIAGNOSIS — Z130 Encounter for screening for diseases of the blood and blood-forming organs and certain disorders involving the immune mechanism: Secondary | ICD-10-CM

## 2017-03-26 DIAGNOSIS — M25532 Pain in left wrist: Secondary | ICD-10-CM

## 2017-03-26 DIAGNOSIS — Z13220 Encounter for screening for lipoid disorders: Secondary | ICD-10-CM | POA: Diagnosis not present

## 2017-03-26 DIAGNOSIS — Z0001 Encounter for general adult medical examination with abnormal findings: Secondary | ICD-10-CM

## 2017-03-26 DIAGNOSIS — I951 Orthostatic hypotension: Secondary | ICD-10-CM | POA: Diagnosis not present

## 2017-03-26 NOTE — Progress Notes (Signed)
[name], MD Phone: [phone]  Keith Cummings is a 36 y.o. male who presents today for physical exam.  Exercises by doing yard work. He is thinking about starting going to the gym. He has cut down on fried foods. Diet is relatively okay. Has cut down on his portions. Eating more fruits and vegetables.  Up-to-date on tetanus vaccination and flu shot. Defers HIV testing. No first-degree relatives with prostate cancer or colon cancer. No tobacco use. Rare alcohol use. No illicit drug use.  Patient does note occasional bilateral dorsal wrist discomfort if he types for more than an hour at work. He does some stretches and it improves. No pain at any other time. No numbness or tingling.  Patient does note occasionally he'll get lightheaded if he squats down and then stands up too quickly. Can also occur if he has been doing work outside and then stops exerting himself and then he'll feel lightheaded and he'll squat down and this will go away. Goes away quickly on its own. No other symptoms with this. No syncope. He does not stay well hydrated.  Active Ambulatory Problems    Diagnosis Date Noted  . Seasonal allergies 12/20/2014  . Wrist pain 06/16/2015  . Encounter for general adult medical examination with abnormal findings 01/02/2016  . Subacute bronchitis 12/13/2016  . Orthostasis 03/26/2017   Resolved Ambulatory Problems    Diagnosis Date Noted  . Tinea pedis 08/05/2012  . Wart 08/05/2012  . General medical exam 08/05/2012  . Chronic intermittant mild left flank pain 11/12/2013  . Routine general medical examination at a health care facility 12/18/2013  . Screening for skin cancer 12/18/2013  . Pre-syncope 01/02/2016  . Urticaria 03/28/2016   Past Medical History:  Diagnosis Date  . Seasonal allergies     Family History  Problem Relation Age of Onset  . Mental illness Mother   . Hyperlipidemia Father   . Hypertension Father   . Hyperlipidemia Sister   .  Cancer Maternal Grandmother        uterus  . Cancer Paternal Grandfather        prostate, bone and lung cancer  . Prostate cancer Maternal Uncle   . Diabetes Neg Hx     Social History   Social History  . Marital status: Married    Spouse name: N/A  . Number of children: N/A  . Years of education: N/A   Occupational History  . Not on file.   Social History Main Topics  . Smoking status: Never Smoker  . Smokeless tobacco: Never Used  . Alcohol use 0.0 oz/week     Comment: Does not drink regularly. Just has occasional beer possibly every couple of months.  . Drug use: No  . Sexual activity: Yes    Partners: Female     Comment: Wife   Other Topics Concern  . Not on file   Social History Narrative   Works at [employer] in [place] with wife and no children    Pets: 1 dog and 1 snake    Caffeine- 1-2 cups of coffee/soda/tea daily    Enjoys movies, computer games, reading     ROS  General:  Negative for nexplained weight loss, fever Skin: Negative for new or changing mole, sore that won't heal HEENT: Negative for trouble hearing, trouble seeing, ringing in ears, mouth sores, hoarseness, change in voice, dysphagia. CV:  Positive for lightheadedness, Negative for chest pain, dyspnea, edema, palpitations Resp: Negative  for cough, dyspnea, hemoptysis GI: Negative for nausea, vomiting, diarrhea, constipation, abdominal pain, melena, hematochezia. GU: Negative for dysuria, incontinence, urinary hesitance, hematuria, vaginal or penile discharge, polyuria, sexual difficulty, lumps in testicle or breasts MSK: Negative for muscle cramps or aches, positive for joint pain or swelling Neuro: Negative for headaches, weakness, numbness, dizziness, passing out/fainting Psych: Negative for depression, anxiety, memory problems  Objective  Physical Exam Vitals:   03/26/17 1332  BP: 138/82  Pulse: 78  Temp: 98.8 F (37.1 C)  SpO2: 98%   Laying blood pressure 144/90  pulse 79 Sitting blood pressure 138/92 pulse 84 Standing blood pressure 122/86 pulse 92  BP Readings from Last 3 Encounters:  03/26/17 138/82  12/13/16 100/66  03/28/16 120/82   Wt Readings from Last 3 Encounters:  03/26/17 186 lb 9.6 oz (84.6 kg)  12/13/16 181 lb 4 oz (82.2 kg)  03/28/16 184 lb 8 oz (83.7 kg)    Physical Exam  Constitutional: No distress.  HENT:  Head: Normocephalic and atraumatic.  Mouth/Throat: Oropharynx is clear and moist. No oropharyngeal exudate.  Eyes: Pupils are equal, round, and reactive to light. Conjunctivae are normal.  Cardiovascular: Normal rate, regular rhythm and normal heart sounds.   Pulmonary/Chest: Effort normal and breath sounds normal.  Abdominal: Soft. Bowel sounds are normal. He exhibits no distension. There is no tenderness. There is no rebound and no guarding.  Musculoskeletal: He exhibits no edema.  No wrist tenderness bilaterally, negative Tinel's bilateral wrists  Neurological: He is alert. Gait normal.  Skin: Skin is warm and dry. He is not diaphoretic.  Psychiatric: Mood and affect normal.     Assessment/Plan:   Encounter for general adult medical examination with abnormal findings Physical exam completed. Discussed diet changes and exercise. He deferred HIV testing. Lab work as outlined below.  Orthostasis Orthostatic on vitals. Suspect related to not drinking enough liquids. Encouraged increase hydration with at least 64 ounces of water daily. If not improving he will let us know.  Wrist pain Suspect related to the position he types in. He is going to get an ergonomic keyboard. If not improving he'll let us know.   Orders Placed This Encounter  Procedures  . CBC    Standing Status:   Future    Standing Expiration Date:   03/26/2018  . Comp Met (CMET)    Standing Status:   Future    Standing Expiration Date:   03/26/2018  . Lipid panel    Standing Status:   Future    Standing Expiration Date:   03/26/2018  .  Hemoglobin A1c    Standing Status:   Future    Standing Expiration Date:   03/26/2018     [name], MD [place]

## 2017-03-26 NOTE — Patient Instructions (Signed)
Nice to see you. Please start to work on exercising. We'll get fasting lab work. Please try to stay well hydrated and if you continue to have issues with lightheadedness please let us know. Please try an ergonomic keyboard.

## 2017-03-26 NOTE — Assessment & Plan Note (Signed)
Physical exam completed. Discussed diet changes and exercise. He deferred HIV testing. Lab work as outlined below.

## 2017-03-26 NOTE — Assessment & Plan Note (Signed)
Orthostatic on vitals. Suspect related to not drinking enough liquids. Encouraged increase hydration with at least 64 ounces of water daily. If not improving he will let us know.

## 2017-03-26 NOTE — Assessment & Plan Note (Signed)
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 He is going to get an ergonomic keyboard. If not improving he'll let us know.

## 2017-03-27 NOTE — Progress Notes (Signed)
Patient is scheduled   

## 2017-04-25 ENCOUNTER — Ambulatory Visit (INDEPENDENT_AMBULATORY_CARE_PROVIDER_SITE_OTHER): Payer: 59

## 2017-04-25 DIAGNOSIS — I951 Orthostatic hypotension: Secondary | ICD-10-CM

## 2017-04-25 NOTE — Progress Notes (Addendum)
Patient came in for a follow up on being orthostatic at last appointment. Dr. Biagio Quint noted that it may be from not consuming enough water and not being very hydrated.  At this time patient did not have any episodes of dizziness or light headedness during checking orthostatics.   Reviewed.  Pt was not orthostatic on exam.  Blood pressure lying 118/68 and standing (after 3 minutes) - 116/78.    Dr Nicki Reaper

## 2017-05-08 DIAGNOSIS — L812 Freckles: Secondary | ICD-10-CM | POA: Diagnosis not present

## 2017-05-08 DIAGNOSIS — L578 Other skin changes due to chronic exposure to nonionizing radiation: Secondary | ICD-10-CM | POA: Diagnosis not present

## 2017-05-08 DIAGNOSIS — D229 Melanocytic nevi, unspecified: Secondary | ICD-10-CM | POA: Diagnosis not present

## 2017-05-08 DIAGNOSIS — Z1283 Encounter for screening for malignant neoplasm of skin: Secondary | ICD-10-CM | POA: Diagnosis not present

## 2017-05-08 DIAGNOSIS — D485 Neoplasm of uncertain behavior of skin: Secondary | ICD-10-CM | POA: Diagnosis not present

## 2017-08-14 ENCOUNTER — Encounter: Payer: Self-pay | Admitting: Family Medicine

## 2017-09-20 ENCOUNTER — Ambulatory Visit (INDEPENDENT_AMBULATORY_CARE_PROVIDER_SITE_OTHER): Payer: No Typology Code available for payment source | Admitting: Family Medicine

## 2017-09-20 ENCOUNTER — Encounter: Payer: Self-pay | Admitting: Family Medicine

## 2017-09-20 VITALS — BP 118/82 | HR 83 | Temp 97.7°F | Resp 17 | Ht 70.0 in | Wt 186.4 lb

## 2017-09-20 DIAGNOSIS — R0683 Snoring: Secondary | ICD-10-CM | POA: Diagnosis not present

## 2017-09-20 NOTE — Assessment & Plan Note (Signed)
Patient has had an increase in his snoring.  No apneic episodes.  He has tried conservative measures with exercise and attempted weight loss as well as nasal devices.  We will refer to ENT to consider oral device versus other management.

## 2017-09-20 NOTE — Progress Notes (Signed)
  Keith Rumps, MD Phone: [phone]  Keith Cummings is a 37 y.o. male who presents today for same day visit.   Snoring: Patient notes a long history of snoring though it has worsened over the last 6-12 months.  His wife has had to leave the room related to this.  He sleeps heavily.  No hypersomnia.  He does wake well rested if he gets enough hours of sleep.  Typically only gets 6 hours.  No apneic episodes.  He has tried nasal devices to help with this as well as allergy nasal sprays with no benefit.  He has not gained weight.  He has been exercising.  Social History   Tobacco Use  Smoking Status Never Smoker  Smokeless Tobacco Never Used     ROS see history of present illness  Objective  Physical Exam Vitals:   09/20/17 0911  BP: 118/82  Pulse: 83  Resp: 17  Temp: 97.7 F (36.5 C)  SpO2: 98%    BP Readings from Last 3 Encounters:  09/20/17 118/82  03/26/17 138/82  12/13/16 100/66   Wt Readings from Last 3 Encounters:  09/20/17 186 lb 6 oz (84.5 kg)  03/26/17 186 lb 9.6 oz (84.6 kg)  12/13/16 181 lb 4 oz (82.2 kg)    Physical Exam  Constitutional: No distress.  HENT:  Head: Normocephalic and atraumatic.  Mouth/Throat: Oropharynx is clear and moist.  Neck: Neck supple.  Cardiovascular: Normal rate, regular rhythm and normal heart sounds.  Pulmonary/Chest: Effort normal and breath sounds normal.  Lymphadenopathy:    He has no cervical adenopathy.  Neurological: He is alert.  Skin: Skin is warm and dry. He is not diaphoretic.     Assessment/Plan: Please see individual problem list.  Snoring Patient has had an increase in his snoring.  No apneic episodes.  He has tried conservative measures with exercise and attempted weight loss as well as nasal devices.  We will refer to ENT to consider oral device versus other management.   Orders Placed This Encounter  Procedures  . Ambulatory referral to ENT    Referral Priority:   Routine    Referral Type:    Consultation    Referral Reason:   Specialty Services Required    Requested Specialty:   Otolaryngology    Number of Visits Requested:   1    No orders of the defined types were placed in this encounter.    Keith Rumps, MD [place]

## 2017-09-20 NOTE — Patient Instructions (Signed)
Nice to see you. We will get you to see ENT for your snoring. Please try to sleep on your side. If you are advised that she have episodes where he stopped breathing at night please let us know.

## 2017-09-25 ENCOUNTER — Encounter: Payer: Self-pay | Admitting: Family Medicine

## 2017-10-02 ENCOUNTER — Telehealth: Payer: Self-pay

## 2017-10-02 NOTE — Telephone Encounter (Signed)
Referral has been refaxed to [facility] fax: [phone]  Copied from [place] [phone]. Topic: Referral - Question >> Oct 01, 2017  4:55 PM Neva Seat wrote: Pt called [facility], they are still needing a fax for the referral. Pt wants to schedule appt asap.

## 2017-11-05 ENCOUNTER — Ambulatory Visit (INDEPENDENT_AMBULATORY_CARE_PROVIDER_SITE_OTHER): Payer: No Typology Code available for payment source | Admitting: Family Medicine

## 2017-11-05 ENCOUNTER — Other Ambulatory Visit: Payer: Self-pay

## 2017-11-05 ENCOUNTER — Encounter: Payer: Self-pay | Admitting: Family Medicine

## 2017-11-05 VITALS — BP 122/80 | HR 86 | Temp 98.5°F | Wt 182.6 lb

## 2017-11-05 DIAGNOSIS — J020 Streptococcal pharyngitis: Secondary | ICD-10-CM | POA: Diagnosis not present

## 2017-11-05 DIAGNOSIS — J029 Acute pharyngitis, unspecified: Secondary | ICD-10-CM | POA: Diagnosis not present

## 2017-11-05 LAB — POCT RAPID STREP A (OFFICE): RAPID STREP A SCREEN: POSITIVE — AB

## 2017-11-05 MED ORDER — PENICILLIN V POTASSIUM 500 MG PO TABS: 500.0000 mg | ORAL_TABLET | Freq: Three times a day (TID) | ORAL | 0 refills | Status: DC

## 2017-11-05 NOTE — Patient Instructions (Signed)
Nice to see you. You have strep throat. We will treat your strep throat with penicillin. If you develop trouble swallowing, fevers, or worsening symptoms please be reevaluated.

## 2017-11-05 NOTE — Progress Notes (Signed)
  [name], MD Phone: [phone]  Keith Cummings is a 37 y.o. male who presents today for f/u.  CC: sore throat  Patient reports 1.5 days of sore throat.  He notes he feels a little sick overall.  No fevers.  Notes he felt a bump in his throat 3 to 7 days ago.  Then developed the sore throat.  He notes no congestion, rhinorrhea, or postnasal drip.  No known sick contacts.  Social History   Tobacco Use  Smoking Status Never Smoker  Smokeless Tobacco Never Used     ROS see history of present illness  Objective  Physical Exam Vitals:   11/05/17 1121  BP: 122/80  Pulse: 86  Temp: 98.5 F (36.9 C)  SpO2: 97%    BP Readings from Last 3 Encounters:  11/05/17 122/80  09/20/17 118/82  03/26/17 138/82   Wt Readings from Last 3 Encounters:  11/05/17 182 lb 9.6 oz (82.8 kg)  09/20/17 186 lb 6 oz (84.5 kg)  03/26/17 186 lb 9.6 oz (84.6 kg)    Physical Exam  Constitutional: No distress.  HENT:  Head: Normocephalic and atraumatic.  Moderate posterior oropharyngeal erythema, no tonsillar swelling, no noted exudate  Eyes: Pupils are equal, round, and reactive to light. Conjunctivae are normal.  Neck: Neck supple.  Cardiovascular: Normal rate and regular rhythm.  Pulmonary/Chest: Effort normal and breath sounds normal.  Lymphadenopathy:    He has no cervical adenopathy.  Skin: He is not diaphoretic.     Assessment/Plan: Please see individual problem list.  Strep throat Positive rapid strep test.  Exam findings consistent.  Will treat with penicillin.  He does have a child at home and I discussed good hygiene as well as wearing a mask to prevent spread.  Based on review of up-to-date it appears after 24 hours of antibiotics 80 to 90% of people clear group A strep from there pharynx.  He will stay out of work tomorrow.  Given return precautions.   Orders Placed This Encounter  Procedures  . POCT rapid strep A    Meds ordered this encounter  Medications    . penicillin v potassium (VEETID) 500 MG tablet    Sig: Take 1 tablet (500 mg total) by mouth 3 (three) times daily.    Dispense:  30 tablet    Refill:  0     [name], MD [employer]

## 2017-11-05 NOTE — Assessment & Plan Note (Signed)
Positive rapid strep test.  Exam findings consistent.  Will treat with penicillin.  He does have a child at home and I discussed good hygiene as well as wearing a mask to prevent spread.  Based on review of up-to-date it appears after 24 hours of antibiotics 80 to 90% of people clear group A strep from there pharynx.  He will stay out of work tomorrow.  Given return precautions.

## 2017-11-07 ENCOUNTER — Ambulatory Visit: Payer: No Typology Code available for payment source | Attending: Otolaryngology

## 2017-11-07 DIAGNOSIS — G4710 Hypersomnia, unspecified: Secondary | ICD-10-CM | POA: Insufficient documentation

## 2017-11-07 DIAGNOSIS — R0683 Snoring: Secondary | ICD-10-CM | POA: Insufficient documentation

## 2017-11-19 ENCOUNTER — Encounter: Payer: Self-pay | Admitting: Family Medicine

## 2017-11-19 ENCOUNTER — Ambulatory Visit: Payer: Self-pay | Admitting: *Deleted

## 2017-11-19 ENCOUNTER — Ambulatory Visit (INDEPENDENT_AMBULATORY_CARE_PROVIDER_SITE_OTHER): Payer: No Typology Code available for payment source | Admitting: Family Medicine

## 2017-11-19 VITALS — BP 118/78 | HR 92 | Temp 98.5°F | Wt 180.0 lb

## 2017-11-19 DIAGNOSIS — J029 Acute pharyngitis, unspecified: Secondary | ICD-10-CM | POA: Diagnosis not present

## 2017-11-19 DIAGNOSIS — J020 Streptococcal pharyngitis: Secondary | ICD-10-CM

## 2017-11-19 LAB — POCT RAPID STREP A (OFFICE): RAPID STREP A SCREEN: POSITIVE — AB

## 2017-11-19 MED ORDER — AMOXICILLIN-POT CLAVULANATE 875-125 MG PO TABS: 1.0000 | ORAL_TABLET | Freq: Two times a day (BID) | ORAL | 0 refills | Status: DC

## 2017-11-19 NOTE — Telephone Encounter (Signed)
He should be rechecked. Please call and advise that I can work him in this morning around 11:45 or offer him an appointment for tomorrow morning. Thanks.

## 2017-11-19 NOTE — Telephone Encounter (Signed)
Patient is scheduled for today.  

## 2017-11-19 NOTE — Progress Notes (Signed)
  [name], MD Phone: [phone]  Keith Cummings is a 37 y.o. male who presents today for f/u.  CC: sore throat  Patient previously treated for strep throat with penicillin.  He notes his symptoms did improve on the penicillin though within 1 to 2 days after coming off of this his symptoms recurred.  Notes his throat feels sore.  Feels like there is something in his throat. This sensation improved with antibiotics.  No cough.  No fevers.  No other symptoms with this.  He is able to swallow.  He did complete the penicillin though did miss a day though completed the full 10-day course.  Social History   Tobacco Use  Smoking Status Never Smoker  Smokeless Tobacco Never Used     ROS see history of present illness  Objective  Physical Exam Vitals:   11/19/17 1147  BP: 118/78  Pulse: 92  Temp: 98.5 F (36.9 C)  SpO2: 97%    BP Readings from Last 3 Encounters:  11/19/17 118/78  11/05/17 122/80  09/20/17 118/82   Wt Readings from Last 3 Encounters:  11/19/17 180 lb (81.6 kg)  11/05/17 182 lb 9.6 oz (82.8 kg)  09/20/17 186 lb 6 oz (84.5 kg)    Physical Exam  Constitutional: No distress.  HENT:  Head: Normocephalic and atraumatic.  Slight posterior oropharyngeal erythema, no exudate or tonsillar swelling  Neck: Neck supple.  Cardiovascular: Normal rate and regular rhythm.  Pulmonary/Chest: Effort normal.  Lymphadenopathy:    He has no cervical adenopathy.  Skin: He is not diaphoretic.     Assessment/Plan: Please see individual problem list.  Strep throat Possible reinfection versus inadequate treatment previously.  Will place on Augmentin.  If he has recurrent symptoms or symptoms do not improve would consider evaluation with ENT.  Given return precautions.   Orders Placed This Encounter  Procedures  . POCT rapid strep A    Meds ordered this encounter  Medications  . amoxicillin-clavulanate (AUGMENTIN) 875-125 MG tablet    Sig: Take 1 tablet by  mouth 2 (two) times daily.    Dispense:  20 tablet    Refill:  0     [name], MD [place]

## 2017-11-19 NOTE — Patient Instructions (Signed)
Nice to see you. It appears you either have a persistent or recurrent strep throat infection.  We will treat with Augmentin.  If your symptoms are not improving please let us know. If you develop fevers or trouble swallowing please be evaluated immediately.

## 2017-11-19 NOTE — Telephone Encounter (Signed)
Please advise 

## 2017-11-19 NOTE — Telephone Encounter (Signed)
Pt reports dx with strep throat 11/05/17, seen by Dr. Caryl Bis.  Placed on VEETID which he completed Sunday 11/17/17.  States sore throat had  resolved but symptoms returned yesterday. Denies SOB, able to eat/drink but states "Feels like something is in my throat, like before." Afebrile, denies any other symptoms. Attempted to call practice for appointment or refill of antibiotics.  Please advise: Pt's # [phone] Reason for Disposition . [1] Taking antibiotic > 72 hours (3 days) for strep throat AND [2] sore throat not improved    Completed course of antibiotics 2 days ago and symptoms have returned.  Answer Assessment - Initial Assessment Questions 1. ANTIBIOTIC: "What antibiotic are you receiving?" "How many times per day?"     Penicillin (VEETID 500mg ) 2. ONSET: "When was the antibiotic started?"     11/05/17 .Marland Kitchen Completed Sunday 11/17/17 3. SEVERITY: "How bad is the sore throat?"    - MILD: doesn't interfere with eating    - MODERATE: interferes with eating some solids    - SEVERE: can't swallow liquids; drooling      mild 4. FEVER: "Do you have a fever?" If so, ask: "What is your temperature, how was it measured, and when did it start?"     no 5. SYMPTOMS: "Are there any other symptoms you're concerned about?" If so, ask: "When did it start?"     LAst night  Protocols used: STREP THROAT INFECTION ON ANTIBIOTIC FOLLOW-UP CALL-A-AH

## 2017-11-19 NOTE — Assessment & Plan Note (Signed)
Possible reinfection versus inadequate treatment previously.  Will place on Augmentin.  If he has recurrent symptoms or symptoms do not improve would consider evaluation with ENT.  Given return precautions.

## 2018-03-28 ENCOUNTER — Encounter: Payer: Self-pay | Admitting: Family Medicine

## 2018-03-28 ENCOUNTER — Ambulatory Visit (INDEPENDENT_AMBULATORY_CARE_PROVIDER_SITE_OTHER): Payer: No Typology Code available for payment source | Admitting: Family Medicine

## 2018-03-28 VITALS — BP 126/78 | HR 100 | Temp 98.4°F | Ht 71.0 in | Wt 181.4 lb

## 2018-03-28 DIAGNOSIS — Z130 Encounter for screening for diseases of the blood and blood-forming organs and certain disorders involving the immune mechanism: Secondary | ICD-10-CM

## 2018-03-28 DIAGNOSIS — Z0001 Encounter for general adult medical examination with abnormal findings: Secondary | ICD-10-CM | POA: Diagnosis not present

## 2018-03-28 DIAGNOSIS — E663 Overweight: Secondary | ICD-10-CM

## 2018-03-28 DIAGNOSIS — Z23 Encounter for immunization: Secondary | ICD-10-CM | POA: Diagnosis not present

## 2018-03-28 DIAGNOSIS — Z13220 Encounter for screening for lipoid disorders: Secondary | ICD-10-CM | POA: Diagnosis not present

## 2018-03-28 LAB — COMPREHENSIVE METABOLIC PANEL
ALT: 26 U/L (ref 0–53)
AST: 17 U/L (ref 0–37)
Albumin: 4.9 g/dL (ref 3.5–5.2)
Alkaline Phosphatase: 21 U/L — ABNORMAL LOW (ref 39–117)
BUN: 14 mg/dL (ref 6–23)
CHLORIDE: 100 meq/L (ref 96–112)
CO2: 33 mEq/L — ABNORMAL HIGH (ref 19–32)
CREATININE: 0.99 mg/dL (ref 0.40–1.50)
Calcium: 9.9 mg/dL (ref 8.4–10.5)
GFR: 91.36 mL/min (ref >60.00)
Glucose, Bld: 106 mg/dL — ABNORMAL HIGH (ref 70–99)
Potassium: 3.7 mEq/L (ref 3.5–5.1)
Sodium: 140 mEq/L (ref 135–145)
Total Bilirubin: 0.4 mg/dL (ref 0.2–1.2)
Total Protein: 8.3 g/dL (ref 6.0–8.3)

## 2018-03-28 LAB — LIPID PANEL
CHOL/HDL RATIO: 7
Cholesterol: 200 mg/dL (ref 0–200)
HDL: 30.6 mg/dL — ABNORMAL LOW (ref >39.00)
NonHDL: 169.05
Triglycerides: 351 mg/dL — ABNORMAL HIGH (ref 0.0–149.0)
VLDL: 70.2 mg/dL — AB (ref 0.0–40.0)

## 2018-03-28 LAB — CBC
HEMATOCRIT: 42.5 % (ref 39.0–52.0)
HEMOGLOBIN: 14.7 g/dL (ref 13.0–17.0)
MCHC: 34.7 g/dL (ref 30.0–36.0)
MCV: 87 fl (ref 78.0–100.0)
Platelets: 265 10*3/uL (ref 150.0–400.0)
RBC: 4.88 Mil/uL (ref 4.22–5.81)
RDW: 12.7 % (ref 11.5–15.5)
WBC: 7.6 10*3/uL (ref 4.0–10.5)

## 2018-03-28 LAB — HEMOGLOBIN A1C: Hgb A1c MFr Bld: 5.3 % (ref 4.6–6.5)

## 2018-03-28 LAB — LDL CHOLESTEROL, DIRECT: Direct LDL: 138 mg/dL

## 2018-03-28 NOTE — Progress Notes (Signed)
[name], MD Phone: [phone]  Keith Cummings is a 37 y.o. male who presents today for cpe.  Exercise: He is not exercising currently though wants to get back to the gym. Diet ranges from healthy did not healthy.  Does try to get plenty of fruits and vegetables.  Some soda and sweet tea as well as juice. Paternal uncle and maternal grandfather with a history of prostate cancer.  No family history of colon cancer. He declines HIV screening. He is going to get a flu shot today.  Tetanus vaccination up-to-date. No tobacco use or illicit drug use.  Rare alcohol use. Sees a dentist twice yearly.  Has not needed to see an ophthalmologist.  Does note at times when staring at his computer screen for too long it feels like his focus is off. He does report having diarrhea and nausea yesterday after eating some food that could be contributing to this.  Notes it has resolved at this point.   He notes stress related to work.  Though no anxiety or depression.  Active Ambulatory Problems    Diagnosis Date Noted  . Seasonal allergies 12/20/2014  . Wrist pain 06/16/2015  . Encounter for general adult medical examination with abnormal findings 01/02/2016  . Orthostasis 03/26/2017  . Snoring 09/20/2017  . Strep throat 11/05/2017   Resolved Ambulatory Problems    Diagnosis Date Noted  . Tinea pedis 08/05/2012  . Wart 08/05/2012  . General medical exam 08/05/2012  . Chronic intermittant mild left flank pain 11/12/2013  . Routine general medical examination at a health care facility 12/18/2013  . Screening for skin cancer 12/18/2013  . Pre-syncope 01/02/2016  . Urticaria 03/28/2016  . Subacute bronchitis 12/13/2016   No Additional Past Medical History    Family History  Problem Relation Age of Onset  . Mental illness Mother   . Hyperlipidemia Father   . Hypertension Father   . Hyperlipidemia Sister   . Cancer Maternal Grandmother        uterus  . Cancer Paternal Grandfather          prostate, bone and lung cancer  . Prostate cancer Maternal Uncle   . Diabetes Neg Hx     Social History   Socioeconomic History  . Marital status: Married    Spouse name: Not on file  . Number of children: Not on file  . Years of education: Not on file  . Highest education level: Not on file  Occupational History  . Not on file  Social Needs  . Financial resource strain: Not on file  . Food insecurity:    Worry: Not on file    Inability: Not on file  . Transportation needs:    Medical: Not on file    Non-medical: Not on file  Tobacco Use  . Smoking status: Never Smoker  . Smokeless tobacco: Never Used  Substance and Sexual Activity  . Alcohol use: Yes    Alcohol/week: 0.0 standard drinks    Comment: Does not drink regularly. Just has occasional beer possibly every couple of months.  . Drug use: No  . Sexual activity: Yes    Partners: Female    Comment: Wife  Lifestyle  . Physical activity:    Days per week: Not on file    Minutes per session: Not on file  . Stress: Not on file  Relationships  . Social connections:    Talks on phone: Not on file    Gets together: Not on file  Attends religious service: Not on file    Active member of club or organization: Not on file    Attends meetings of clubs or organizations: Not on file    Relationship status: Not on file  . Intimate partner violence:    Fear of current or ex partner: Not on file    Emotionally abused: Not on file    Physically abused: Not on file    Forced sexual activity: Not on file  Other Topics Concern  . Not on file  Social History Narrative   Works at [employer] in [place] with wife and no children    Pets: 1 dog and 1 snake    Caffeine- 1-2 cups of coffee/soda/tea daily    Enjoys movies, computer games, reading     ROS  General:  Negative for nexplained weight loss, fever Skin: Negative for new or changing mole, sore that won't heal HEENT: Negative for trouble  hearing, trouble seeing, ringing in ears, mouth sores, hoarseness, change in voice, dysphagia. CV:  Negative for chest pain, dyspnea, edema, palpitations Resp: Negative for cough, dyspnea, hemoptysis GI: Positive for nausea, diarrhea, negative for vomiting, constipation, abdominal pain, melena, hematochezia. GU: Negative for dysuria, incontinence, urinary hesitance, hematuria, vaginal or penile discharge, polyuria, sexual difficulty, lumps in testicle or breasts MSK: Negative for muscle cramps or aches, joint pain or swelling Neuro: Negative for headaches, weakness, numbness, dizziness, passing out/fainting Psych: Positive for stress, negative for depression, anxiety, memory problems  Objective  Physical Exam Vitals:   03/28/18 1348  BP: 126/78  Pulse: 100  Temp: 98.4 F (36.9 C)  SpO2: 97%    BP Readings from Last 3 Encounters:  03/28/18 126/78  11/19/17 118/78  11/05/17 122/80   Wt Readings from Last 3 Encounters:  03/28/18 181 lb 6.4 oz (82.3 kg)  11/19/17 180 lb (81.6 kg)  11/05/17 182 lb 9.6 oz (82.8 kg)    Physical Exam  Constitutional: No distress.  HENT:  Head: Normocephalic and atraumatic.  Mouth/Throat: Oropharynx is clear and moist.  Eyes: Pupils are equal, round, and reactive to light. Conjunctivae are normal.  Neck: Neck supple.  Cardiovascular: Normal rate, regular rhythm and normal heart sounds.  Pulmonary/Chest: Effort normal and breath sounds normal.  Abdominal: Soft. Bowel sounds are normal. He exhibits no distension. There is no tenderness. There is no rebound and no guarding.  Musculoskeletal: He exhibits no edema.  Lymphadenopathy:    He has no cervical adenopathy.  Neurological: He is alert.  Skin: Skin is warm and dry. He is not diaphoretic.  Psychiatric: He has a normal mood and affect.     Assessment/Plan:   Encounter for general adult medical examination with abnormal findings Physical exam completed.  Encouraged diet and exercise.   Patient declined HIV screening.  Flu vaccination given today.  Lab work as outlined below.  Discussed need for starting prostate cancer screening at age 41.   Orders Placed This Encounter  Procedures  . Flu Vaccine QUAD 36+ mos IM  . Lipid panel  . Comp Met (CMET)  . HgB A1c  . CBC    No orders of the defined types were placed in this encounter.    [name], MD [place]

## 2018-03-28 NOTE — Assessment & Plan Note (Signed)
Physical exam completed.  Encouraged diet and exercise.  Patient declined HIV screening.  Flu vaccination given today.  Lab work as outlined below.  Discussed need for starting prostate cancer screening at age 37.

## 2018-03-28 NOTE — Patient Instructions (Signed)
Nice to see you. Please work on diet and exercise.  Goal exercise is 150 minutes a week. Please decrease sugary drink intake. We will get lab work today and contact you with results.

## 2018-03-31 ENCOUNTER — Encounter: Payer: Self-pay | Admitting: Family Medicine

## 2018-04-02 ENCOUNTER — Encounter: Payer: Self-pay | Admitting: *Deleted

## 2018-07-22 ENCOUNTER — Ambulatory Visit (INDEPENDENT_AMBULATORY_CARE_PROVIDER_SITE_OTHER): Payer: No Typology Code available for payment source | Admitting: Family Medicine

## 2018-07-22 ENCOUNTER — Encounter: Payer: Self-pay | Admitting: Family Medicine

## 2018-07-22 VITALS — BP 102/80 | Temp 98.0°F | Ht 71.0 in | Wt 182.2 lb

## 2018-07-22 DIAGNOSIS — E785 Hyperlipidemia, unspecified: Secondary | ICD-10-CM | POA: Insufficient documentation

## 2018-07-22 DIAGNOSIS — E781 Pure hyperglyceridemia: Secondary | ICD-10-CM | POA: Diagnosis not present

## 2018-07-22 DIAGNOSIS — R0683 Snoring: Secondary | ICD-10-CM

## 2018-07-22 DIAGNOSIS — E663 Overweight: Secondary | ICD-10-CM | POA: Insufficient documentation

## 2018-07-22 DIAGNOSIS — J358 Other chronic diseases of tonsils and adenoids: Secondary | ICD-10-CM | POA: Insufficient documentation

## 2018-07-22 DIAGNOSIS — D229 Melanocytic nevi, unspecified: Secondary | ICD-10-CM

## 2018-07-22 NOTE — Assessment & Plan Note (Signed)
Improved.  Sleep study without any apnea.  He will monitor.

## 2018-07-22 NOTE — Progress Notes (Signed)
Keith Rumps, MD Phone: [phone]  [name] is a 38 y.o. male who presents today for follow-up.  CC: Snoring, tonsil stones, multiple nevi, overweight  Snoring: Patient underwent a sleep study for this that did not reveal sleep apnea.  He notes they figured out it was his retainer and he has no issues when not wearing this.  No apnea.  Tonsil stones: He has noted these over the last several months.  He notes twice a week level and come out.  Feels like a popcorn kernel that comes out of the back of his throat.  Notes it is a little uncomfortable.  No other significant symptoms.  Multiple nevi: Patient has a history of mole removal.  He needs a yearly referral to dermatology for recheck.  He notes no new skin changes.  Overweight: Patient's not been doing much exercise recently though plans to start.  He has changed his diet to some degree and is not eating much fast food.  He eats a standard diet.  Less soda and sweet tea.  Patient needs adoption paperwork filled out.  He did not meet any criteria for TB skin test.  He has no history of communicable disease.  No limitations physical activity.  No behavioral health issues or mental health diagnoses.  Social History   Tobacco Use  Smoking Status Never Smoker  Smokeless Tobacco Never Used     ROS see history of present illness  Objective  Physical Exam Vitals:   07/22/18 1117  BP: 102/80  Temp: 98 F (36.7 C)    BP Readings from Last 3 Encounters:  07/22/18 102/80  03/28/18 126/78  11/19/17 118/78   Wt Readings from Last 3 Encounters:  07/22/18 182 lb 3.2 oz (82.6 kg)  03/28/18 181 lb 6.4 oz (82.3 kg)  11/19/17 180 lb (81.6 kg)    Physical Exam Constitutional:      General: He is not in acute distress.    Appearance: He is not diaphoretic.  HENT:     Mouth/Throat:     Mouth: Mucous membranes are moist.     Pharynx: Oropharynx is clear. No oropharyngeal exudate or posterior oropharyngeal erythema.    Cardiovascular:     Rate and Rhythm: Normal rate and regular rhythm.     Heart sounds: Normal heart sounds.  Pulmonary:     Effort: Pulmonary effort is normal.     Breath sounds: Normal breath sounds.  Lymphadenopathy:     Cervical: No cervical adenopathy.  Skin:    General: Skin is warm and dry.  Neurological:     Mental Status: He is alert.      Assessment/Plan: Please see individual problem list.  Tonsil stone Refer to ENT for evaluation.  Snoring Improved.  Sleep study without any apnea.  He will monitor.  Multiple nevi Dermatology referral placed.  Overweight Encouraged continuing with dietary changes.  Discussed adding in some exercise.  Hypertriglyceridemia Recheck in April.  Encouraged diet and exercise.    Orders Placed This Encounter  Procedures  . Lipid panel    Standing Status:   Future    Standing Expiration Date:   07/23/2019  . Ambulatory referral to ENT    Referral Priority:   Routine    Referral Type:   Consultation    Referral Reason:   Specialty Services Required    Requested Specialty:   Otolaryngology    Number of Visits Requested:   1  . Ambulatory referral to Dermatology    Referral  Priority:   Routine    Referral Type:   Consultation    Referral Reason:   Specialty Services Required    Requested Specialty:   Dermatology    Number of Visits Requested:   1    No orders of the defined types were placed in this encounter.    Keith Rumps, MD [place]

## 2018-07-22 NOTE — Assessment & Plan Note (Signed)
Refer to ENT for evaluation 

## 2018-07-22 NOTE — Assessment & Plan Note (Signed)
Dermatology referral placed

## 2018-07-22 NOTE — Assessment & Plan Note (Signed)
Encouraged continuing with dietary changes.  Discussed adding in some exercise.

## 2018-07-22 NOTE — Assessment & Plan Note (Signed)
Recheck in April.  Encouraged diet and exercise.

## 2018-07-22 NOTE — Patient Instructions (Signed)
Nice to see you. Please try to add in exercise.  This could be 2 to 3 days a week to start with. I have referred you to ENT and dermatology.

## 2018-07-25 ENCOUNTER — Telehealth: Payer: Self-pay

## 2018-07-25 NOTE — Telephone Encounter (Signed)
Copied from [place] [id number]. Topic: Referral - Question >> Jul 25, 2018  2:39 PM Rayann Heman wrote: Reason for CRM: pt called and stated that he would like his ENT referral sent over to [facility] in [place]. [place]

## 2018-07-26 NOTE — Telephone Encounter (Signed)
I will forward to [facility] to send ENT referral to Dr Lucia Gaskins in [place].

## 2018-07-31 NOTE — Telephone Encounter (Signed)
Pt called back and states Dr Lucia Gaskins office needs the referral sent to them, as they have not received anything.

## 2018-09-12 ENCOUNTER — Ambulatory Visit: Payer: No Typology Code available for payment source | Admitting: Family Medicine

## 2018-09-23 ENCOUNTER — Other Ambulatory Visit (INDEPENDENT_AMBULATORY_CARE_PROVIDER_SITE_OTHER): Payer: No Typology Code available for payment source

## 2018-09-23 ENCOUNTER — Other Ambulatory Visit: Payer: Self-pay

## 2018-09-23 DIAGNOSIS — E781 Pure hyperglyceridemia: Secondary | ICD-10-CM | POA: Diagnosis not present

## 2018-09-23 LAB — LIPID PANEL
Cholesterol: 190 mg/dL (ref 0–200)
HDL: 31.5 mg/dL — ABNORMAL LOW (ref >39.00)
NonHDL: 158.64
Total CHOL/HDL Ratio: 6
Triglycerides: 207 mg/dL — ABNORMAL HIGH (ref 0.0–149.0)
VLDL: 41.4 mg/dL — ABNORMAL HIGH (ref 0.0–40.0)

## 2018-09-23 LAB — LDL CHOLESTEROL, DIRECT: Direct LDL: 123 mg/dL

## 2018-09-24 ENCOUNTER — Encounter: Payer: Self-pay | Admitting: Family Medicine

## 2018-09-24 ENCOUNTER — Ambulatory Visit (INDEPENDENT_AMBULATORY_CARE_PROVIDER_SITE_OTHER): Payer: No Typology Code available for payment source | Admitting: Family Medicine

## 2018-09-24 DIAGNOSIS — J358 Other chronic diseases of tonsils and adenoids: Secondary | ICD-10-CM

## 2018-09-24 DIAGNOSIS — E781 Pure hyperglyceridemia: Secondary | ICD-10-CM | POA: Diagnosis not present

## 2018-09-24 DIAGNOSIS — R0789 Other chest pain: Secondary | ICD-10-CM

## 2018-09-24 NOTE — Progress Notes (Signed)
Virtual Visit via telephone Note  This visit type was conducted due to national recommendations for restrictions regarding the COVID-19 pandemic (e.g. social distancing).  This format is felt to be most appropriate for this patient at this time.  All issues noted in this document were discussed and addressed.  No physical exam was performed (except for noted visual exam findings with Video Visits).   I connected with Keith Cummings on 09/25/18 at  1:45 PM EDT by a video enabled telemedicine application and verified that I am speaking with the correct person using two identifiers. Location patient: home Location provider: work  Persons participating in the virtual visit: patient, provider  I discussed the limitations, risks, security and privacy concerns of performing an evaluation and management service by telephone and the availability of in person appointments. I also discussed with the patient that there may be a patient responsible charge related to this service. The patient expressed understanding and agreed to proceed.  Reason for visit: follow-up  HPI: Hypertriglyceridemia: no dyspnea. See below for chest pain discussion. He has been walking a lot for exercise 30 minutes at a time. Not much heavy exercise though has cut the grass on several occasions with no issues. He is eating a standard diet. Has cut down on soda and tea.   Chest discomfort: patient reports discomfort in his left costochondral area that has been occurring intermittently for the past year. Occurs once every 1-2 weeks. Lasts for 15-30 minutes. Can occur at anytime. Is not worsened or brought on by exertion. No precipitating factors. No alleviating factors. Resolves on its own. No cough, wheezing, or fevers. No family history of cardiac disease. Describes the discomfort as a tug. No GERD symptoms. No radiation of the discomfort. No diaphoresis with this.   Tonsil stones/snoring: patient saw ENT. They advised a trial of  flonase nightly and afrin twice weekly. He is unsure if this has been beneficial. He notes that they found a polyp in his nasal cavity and are trying these medications to see if this is a nasal issue or a throat issue.   ROS: See pertinent positives and negatives per HPI.  Past Medical History:  Diagnosis Date  . Seasonal allergies     Past Surgical History:  Procedure Laterality Date  . NO PAST SURGERIES      Family History  Problem Relation Age of Onset  . Mental illness Mother   . Hyperlipidemia Father   . Hypertension Father   . Hyperlipidemia Sister   . Cancer Maternal Grandmother        uterus  . Cancer Paternal Grandfather        prostate, bone and lung cancer  . Prostate cancer Maternal Uncle   . Diabetes Neg Hx     SOCIAL HX: none   Current Outpatient Medications:  .  Clotrimazole 1 % OINT, , Disp: , Rfl:  .  fluticasone (FLONASE) 50 MCG/ACT nasal spray, Place into both nostrils daily., Disp: , Rfl:   EXAM:  VITALS per patient if applicable: none.  GENERAL: alert, oriented, appears well and in no acute distress  HEENT: atraumatic, conjunttiva clear, no obvious abnormalities on inspection of external nose and ears  NECK: normal movements of the head and neck  LUNGS: on inspection no signs of respiratory distress, breathing rate appears normal, no obvious gross SOB, gasping or wheezing  CV: no obvious cyanosis  MS: moves all visible extremities without noticeable abnormality  PSYCH/NEURO: pleasant and cooperative, no obvious depression or anxiety,  speech and thought processing grossly intact  ASSESSMENT AND PLAN:  Discussed the following assessment and plan:  Tonsil stone  Chest discomfort  Hypertriglyceridemia  Tonsil stone He will continue to follow with ENT. Discussed not over using afrin.   Chest discomfort I suspect this is chest wall discomfort based on the tugging description and lack of typical cardiac symptoms. Doubt pulmonary or GI  cause. Discussed monitoring and paying closer attention to this discomfort when it occurs to identify possible precipitating factors. Given return precautions.   Hypertriglyceridemia Discussed diet and exercise. Patient will add 2-3 days of more strenuous exercise.   I discussed social distancing and sick precautions with regards to [place].    I discussed the assessment and treatment plan with the patient. The patient was provided an opportunity to ask questions and all were answered. The patient agreed with the plan and demonstrated an understanding of the instructions.   The patient was advised to call back or seek an in-person evaluation if the symptoms worsen or if the condition fails to improve as anticipated.    [name], MD

## 2018-09-25 DIAGNOSIS — R0789 Other chest pain: Secondary | ICD-10-CM | POA: Insufficient documentation

## 2018-09-25 NOTE — Assessment & Plan Note (Signed)
I suspect this is chest wall discomfort based on the tugging description and lack of typical cardiac symptoms. Doubt pulmonary or GI cause. Discussed monitoring and paying closer attention to this discomfort when it occurs to identify possible precipitating factors. Given return precautions.

## 2018-09-25 NOTE — Assessment & Plan Note (Signed)
Discussed diet and exercise. Patient will add 2-3 days of more strenuous exercise.

## 2018-09-25 NOTE — Assessment & Plan Note (Signed)
He will continue to follow with ENT. Discussed not over using afrin.

## 2018-11-02 ENCOUNTER — Encounter: Payer: Self-pay | Admitting: Family Medicine

## 2018-11-04 ENCOUNTER — Encounter: Payer: Self-pay | Admitting: Family Medicine

## 2018-11-04 NOTE — Telephone Encounter (Signed)
Responded to other mychart message.

## 2018-11-05 NOTE — Telephone Encounter (Signed)
Called and spoke to pt.  Pt confirmed that he did receive the MyChart message that you sent yesterday.  He also sent you this updated message today.  Pt said that redness from tick bite has spread and is more red.  Pt denies having fever and no other symptoms.  Pt was scheduled a virtual visit with Philis Nettle on tomorrow (11/06/18) @ 10:20 am.

## 2018-11-05 NOTE — Telephone Encounter (Signed)
Patient scheduled for a virtual visit on 11/06/2018.

## 2018-11-06 ENCOUNTER — Other Ambulatory Visit: Payer: Self-pay

## 2018-11-06 ENCOUNTER — Ambulatory Visit (INDEPENDENT_AMBULATORY_CARE_PROVIDER_SITE_OTHER): Payer: No Typology Code available for payment source | Admitting: Family Medicine

## 2018-11-06 ENCOUNTER — Encounter: Payer: Self-pay | Admitting: Family Medicine

## 2018-11-06 DIAGNOSIS — W57XXXA Bitten or stung by nonvenomous insect and other nonvenomous arthropods, initial encounter: Secondary | ICD-10-CM

## 2018-11-06 DIAGNOSIS — S30861A Insect bite (nonvenomous) of abdominal wall, initial encounter: Secondary | ICD-10-CM

## 2018-11-06 MED ORDER — DOXYCYCLINE HYCLATE 100 MG PO TABS: 100.0000 mg | ORAL_TABLET | Freq: Two times a day (BID) | ORAL | 0 refills | Status: DC

## 2018-11-06 NOTE — Progress Notes (Signed)
Patient ID: Keith Cummings, male   DOB: 03/14/1981, 38 y.o.   MRN: [id number]    Virtual Visit via video Note  This visit type was conducted due to national recommendations for restrictions regarding the COVID-19 pandemic (e.g. social distancing).  This format is felt to be most appropriate for this patient at this time.  All issues noted in this document were discussed and addressed.  No physical exam was performed (except for noted visual exam findings with Video Visits).   I connected with Keith Cummings today at 10:20 AM EDT by a video enabled telemedicine application and verified that I am speaking with the correct person using two identifiers. Location patient: home Location provider: [facility] Persons participating in the virtual visit: patient, provider  I discussed the limitations, risks, security and privacy concerns of performing an evaluation and management service by video and the availability of in person appointments. I also discussed with the patient that there may be a patient responsible charge related to this service. The patient expressed understanding and agreed to proceed.  HPI:  Patient and I connected via video due to tick bite.  Patient went to the drive-in movie theater over the [facility] Day weekend and was walking around a wooded area.  Upon showering the next day he noticed a tick in his groin area.  Was able to successfully remove the tick.  Watch the area over the next couple of days and noticed it becoming a little more red and raised.  Denies seeing a bull's-eye ring type rash around the area.  Area is slightly tender.  No pus or drainage from area.  No fever or chills.  No body aches.  No cough, shortness of breath or wheezing.  No GI or GU complaints.  Patient cannot be 100% sure how long the tick was on him but he asked suspects the tick was embedded in his skin anywhere from 12 to 20 hours.   ROS: See pertinent positives and negatives per HPI.   Past Medical History:  Diagnosis Date  . Seasonal allergies     Past Surgical History:  Procedure Laterality Date  . NO PAST SURGERIES      Family History  Problem Relation Age of Onset  . Mental illness Mother   . Hyperlipidemia Father   . Hypertension Father   . Hyperlipidemia Sister   . Cancer Maternal Grandmother        uterus  . Cancer Paternal Grandfather        prostate, bone and lung cancer  . Prostate cancer Maternal Uncle   . Diabetes Neg Hx    Social History   Tobacco Use  . Smoking status: Never Smoker  . Smokeless tobacco: Never Used  Substance Use Topics  . Alcohol use: Yes    Alcohol/week: 0.0 standard drinks    Comment: Does not drink regularly. Just has occasional beer possibly every couple of months.    Current Outpatient Medications:  .  Clotrimazole 1 % OINT, , Disp: , Rfl:  .  fluticasone (FLONASE) 50 MCG/ACT nasal spray, Place into both nostrils daily., Disp: , Rfl:   EXAM:  GENERAL: alert, oriented, appears well and in no acute distress  HEENT: atraumatic, conjunttiva clear, no obvious abnormalities on inspection of external nose and ears  NECK: normal movements of the head and neck  LUNGS: on inspection no signs of respiratory distress, breathing rate appears normal, no obvious gross SOB, gasping or wheezing  CV: no obvious cyanosis  SKIN:  Small red area lower ABD/groin area consistent with tick bite. No obvious bullseye rash. No drainage from area.   MS: moves all visible extremities without noticeable abnormality  PSYCH/NEURO: pleasant and cooperative, no obvious depression or anxiety, speech and thought processing grossly intact  ASSESSMENT AND PLAN:  Discussed the following assessment and plan:  Tick bite of abdomen, initial encounter - Groin area - Plan: doxycycline (VIBRA-TABS) 100 MG tablet  Due to length of time that the tick potentially was on patient, I think it would be in our best interest to cover him with doxycycline  course.  Advised patient that there is not a lot of Lyme disease here in [place], more often we will see a [facility] spotted fever in our geographical area.  However, doxycycline will cover both Lyme disease and [facility] spotted fever.  Advised patient that we could have him come in for blood work, but we would not want to draw blood for at least 2 to 3 weeks after the tick bite because we need time to allow the antibodies to be present in his blood for the test to be worthwhile.   Patient will take doxycycline course and monitor area and cell for any worsening or development of new symptoms.  He will let us know if anything changes.  At this time we will hold off on putting any blood work orders in.   I discussed the assessment and treatment plan with the patient. The patient was provided an opportunity to ask questions and all were answered. The patient agreed with the plan and demonstrated an understanding of the instructions.   The patient was advised to call back or seek an in-person evaluation if the symptoms worsen or if the condition fails to improve as anticipated.   Jodelle Green, FNP

## 2018-12-31 ENCOUNTER — Encounter: Payer: Self-pay | Admitting: Family Medicine

## 2019-01-07 ENCOUNTER — Other Ambulatory Visit: Payer: Self-pay

## 2019-01-07 DIAGNOSIS — Z20822 Contact with and (suspected) exposure to covid-19: Secondary | ICD-10-CM

## 2019-01-09 LAB — NOVEL CORONAVIRUS, NAA: SARS-CoV-2, NAA: NOT DETECTED

## 2019-01-14 ENCOUNTER — Telehealth: Payer: No Typology Code available for payment source | Admitting: Family Medicine

## 2019-03-27 ENCOUNTER — Other Ambulatory Visit: Payer: Self-pay

## 2019-03-27 DIAGNOSIS — Z20822 Contact with and (suspected) exposure to covid-19: Secondary | ICD-10-CM

## 2019-03-29 LAB — NOVEL CORONAVIRUS, NAA: SARS-CoV-2, NAA: NOT DETECTED

## 2019-04-02 ENCOUNTER — Other Ambulatory Visit: Payer: Self-pay

## 2019-04-02 DIAGNOSIS — Z20822 Contact with and (suspected) exposure to covid-19: Secondary | ICD-10-CM

## 2019-04-03 ENCOUNTER — Encounter: Payer: No Typology Code available for payment source | Admitting: Family Medicine

## 2019-04-04 LAB — NOVEL CORONAVIRUS, NAA: SARS-CoV-2, NAA: NOT DETECTED

## 2019-06-02 ENCOUNTER — Encounter: Payer: Self-pay | Admitting: Family Medicine

## 2019-06-02 ENCOUNTER — Other Ambulatory Visit: Payer: Self-pay

## 2019-06-02 ENCOUNTER — Ambulatory Visit (INDEPENDENT_AMBULATORY_CARE_PROVIDER_SITE_OTHER): Payer: No Typology Code available for payment source | Admitting: Family Medicine

## 2019-06-02 DIAGNOSIS — I781 Nevus, non-neoplastic: Secondary | ICD-10-CM | POA: Insufficient documentation

## 2019-06-02 DIAGNOSIS — Z0001 Encounter for general adult medical examination with abnormal findings: Secondary | ICD-10-CM

## 2019-06-02 DIAGNOSIS — M25511 Pain in right shoulder: Secondary | ICD-10-CM | POA: Insufficient documentation

## 2019-06-02 DIAGNOSIS — L853 Xerosis cutis: Secondary | ICD-10-CM | POA: Diagnosis not present

## 2019-06-02 NOTE — Assessment & Plan Note (Signed)
Seems to have resolved with stopping use of soap.  Discussed that it should be okay for him to use soap couple of times a week.  He will monitor.

## 2019-06-02 NOTE — Assessment & Plan Note (Signed)
I suspect this is positionally related.  We will have him complete exercises at home.  He will monitor.  I did suggest altering his desk set up if possible.

## 2019-06-02 NOTE — Patient Instructions (Signed)
Shoulder Exercises Ask your health care provider which exercises are safe for you. Do exercises exactly as told by your health care provider and adjust them as directed. It is normal to feel mild stretching, pulling, tightness, or discomfort as you do these exercises. Stop right away if you feel sudden pain or your pain gets worse. Do not begin these exercises until told by your health care provider. Stretching exercises External rotation and abduction This exercise is sometimes called corner stretch. This exercise rotates your arm outward (external rotation) and moves your arm out from your body (abduction). 1. Stand in a doorway with one of your feet slightly in front of the other. This is called a staggered stance. If you cannot reach your forearms to the door frame, stand facing a corner of a room. 2. Choose one of the following positions as told by your health care provider: ? Place your hands and forearms on the door frame above your head. ? Place your hands and forearms on the door frame at the height of your head. ? Place your hands on the door frame at the height of your elbows. 3. Slowly move your weight onto your front foot until you feel a stretch across your chest and in the front of your shoulders. Keep your head and chest upright and keep your abdominal muscles tight. 4. Hold for __________ seconds. 5. To release the stretch, shift your weight to your back foot. Repeat __________ times. Complete this exercise __________ times a day. Extension, standing 1. Stand and hold a broomstick, a cane, or a similar object behind your back. ? Your hands should be a little wider than shoulder width apart. ? Your palms should face away from your back. 2. Keeping your elbows straight and your shoulder muscles relaxed, move the stick away from your body until you feel a stretch in your shoulders (extension). ? Avoid shrugging your shoulders while you move the stick. Keep your shoulder blades tucked  down toward the middle of your back. 3. Hold for __________ seconds. 4. Slowly return to the starting position. Repeat __________ times. Complete this exercise __________ times a day. Range-of-motion exercises Pendulum  1. Stand near a wall or a surface that you can hold onto for balance. 2. Bend at the waist and let your left / right arm hang straight down. Use your other arm to support you. Keep your back straight and do not lock your knees. 3. Relax your left / right arm and shoulder muscles, and move your hips and your trunk so your left / right arm swings freely. Your arm should swing because of the motion of your body, not because you are using your arm or shoulder muscles. 4. Keep moving your hips and trunk so your arm swings in the following directions, as told by your health care provider: ? Side to side. ? Forward and backward. ? In clockwise and counterclockwise circles. 5. Continue each motion for __________ seconds, or for as long as told by your health care provider. 6. Slowly return to the starting position. Repeat __________ times. Complete this exercise __________ times a day. Shoulder flexion, standing  1. Stand and hold a broomstick, a cane, or a similar object. Place your hands a little more than shoulder width apart on the object. Your left / right hand should be palm up, and your other hand should be palm down. 2. Keep your elbow straight and your shoulder muscles relaxed. Push the stick up with your healthy arm to   raise your left / right arm in front of your body, and then over your head until you feel a stretch in your shoulder (flexion). ? Avoid shrugging your shoulder while you raise your arm. Keep your shoulder blade tucked down toward the middle of your back. 3. Hold for __________ seconds. 4. Slowly return to the starting position. Repeat __________ times. Complete this exercise __________ times a day. Shoulder abduction, standing 1. Stand and hold a broomstick,  a cane, or a similar object. Place your hands a little more than shoulder width apart on the object. Your left / right hand should be palm up, and your other hand should be palm down. 2. Keep your elbow straight and your shoulder muscles relaxed. Push the object across your body toward your left / right side. Raise your left / right arm to the side of your body (abduction) until you feel a stretch in your shoulder. ? Do not raise your arm above shoulder height unless your health care provider tells you to do that. ? If directed, raise your arm over your head. ? Avoid shrugging your shoulder while you raise your arm. Keep your shoulder blade tucked down toward the middle of your back. 3. Hold for __________ seconds. 4. Slowly return to the starting position. Repeat __________ times. Complete this exercise __________ times a day. Internal rotation  1. Place your left / right hand behind your back, palm up. 2. Use your other hand to dangle an exercise band, a towel, or a similar object over your shoulder. Grasp the band with your left / right hand so you are holding on to both ends. 3. Gently pull up on the band until you feel a stretch in the front of your left / right shoulder. The movement of your arm toward the center of your body is called internal rotation. ? Avoid shrugging your shoulder while you raise your arm. Keep your shoulder blade tucked down toward the middle of your back. 4. Hold for __________ seconds. 5. Release the stretch by letting go of the band and lowering your hands. Repeat __________ times. Complete this exercise __________ times a day. Strengthening exercises External rotation  1. Sit in a stable chair without armrests. 2. Secure an exercise band to a stable object at elbow height on your left / right side. 3. Place a soft object, such as a folded towel or a small pillow, between your left / right upper arm and your body to move your elbow about 4 inches (10 cm) away  from your side. 4. Hold the end of the exercise band so it is tight and there is no slack. 5. Keeping your elbow pressed against the soft object, slowly move your forearm out, away from your abdomen (external rotation). Keep your body steady so only your forearm moves. 6. Hold for __________ seconds. 7. Slowly return to the starting position. Repeat __________ times. Complete this exercise __________ times a day. Shoulder abduction  1. Sit in a stable chair without armrests, or stand up. 2. Hold a __________ weight in your left / right hand, or hold an exercise band with both hands. 3. Start with your arms straight down and your left / right palm facing in, toward your body. 4. Slowly lift your left / right hand out to your side (abduction). Do not lift your hand above shoulder height unless your health care provider tells you that this is safe. ? Keep your arms straight. ? Avoid shrugging your shoulder while you   do this movement. Keep your shoulder blade tucked down toward the middle of your back. 5. Hold for __________ seconds. 6. Slowly lower your arm, and return to the starting position. Repeat __________ times. Complete this exercise __________ times a day. Shoulder extension 1. Sit in a stable chair without armrests, or stand up. 2. Secure an exercise band to a stable object in front of you so it is at shoulder height. 3. Hold one end of the exercise band in each hand. Your palms should face each other. 4. Straighten your elbows and lift your hands up to shoulder height. 5. Step back, away from the secured end of the exercise band, until the band is tight and there is no slack. 6. Squeeze your shoulder blades together as you pull your hands down to the sides of your thighs (extension). Stop when your hands are straight down by your sides. Do not let your hands go behind your body. 7. Hold for __________ seconds. 8. Slowly return to the starting position. Repeat __________ times.  Complete this exercise __________ times a day. Shoulder row 1. Sit in a stable chair without armrests, or stand up. 2. Secure an exercise band to a stable object in front of you so it is at waist height. 3. Hold one end of the exercise band in each hand. Position your palms so that your thumbs are facing the ceiling (neutral position). 4. Bend each of your elbows to a 90-degree angle (right angle) and keep your upper arms at your sides. 5. Step back until the band is tight and there is no slack. 6. Slowly pull your elbows back behind you. 7. Hold for __________ seconds. 8. Slowly return to the starting position. Repeat __________ times. Complete this exercise __________ times a day. Shoulder press-ups  1. Sit in a stable chair that has armrests. Sit upright, with your feet flat on the floor. 2. Put your hands on the armrests so your elbows are bent and your fingers are pointing forward. Your hands should be about even with the sides of your body. 3. Push down on the armrests and use your arms to lift yourself off the chair. Straighten your elbows and lift yourself up as much as you comfortably can. ? Move your shoulder blades down, and avoid letting your shoulders move up toward your ears. ? Keep your feet on the ground. As you get stronger, your feet should support less of your body weight as you lift yourself up. 4. Hold for __________ seconds. 5. Slowly lower yourself back into the chair. Repeat __________ times. Complete this exercise __________ times a day. Wall push-ups  1. Stand so you are facing a stable wall. Your feet should be about one arm-length away from the wall. 2. Lean forward and place your palms on the wall at shoulder height. 3. Keep your feet flat on the floor as you bend your elbows and lean forward toward the wall. 4. Hold for __________ seconds. 5. Straighten your elbows to push yourself back to the starting position. Repeat __________ times. Complete this exercise  __________ times a day. This information is not intended to replace advice given to you by your health care provider. Make sure you discuss any questions you have with your health care provider. Document Released: 04/11/2005 Document Revised: 09/19/2018 Document Reviewed: 06/27/2018 Elsevier Patient Education  2020 [employer]  

## 2019-06-02 NOTE — Assessment & Plan Note (Signed)
Asymptomatic.  Discussed these are typically just cosmetic.  If he develops any symptoms he will let us know.  He will monitor for now.

## 2019-06-02 NOTE — Progress Notes (Signed)
Virtual Visit via video Note  This visit type was conducted due to national recommendations for restrictions regarding the COVID-19 pandemic (e.g. social distancing).  This format is felt to be most appropriate for this patient at this time.  All issues noted in this document were discussed and addressed.  No physical exam was performed (except for noted visual exam findings with Video Visits).   I connected with Hester Mates today at 10:00 AM EST by a video enabled telemedicine application and verified that I am speaking with the correct person using two identifiers. Location patient: home Location provider: work  Persons participating in the virtual visit: patient, provider  I discussed the limitations, risks, security and privacy concerns of performing an evaluation and management service by telephone and the availability of in person appointments. I also discussed with the patient that there may be a patient responsible charge related to this service. The patient expressed understanding and agreed to proceed.   Reason for visit: CPE  HPI: Diet: Reports normal diet.  Has been drinking more liquids though is drinking sugary drinks as well.  Consistent vegetables. Exercise: Goes for daily walks for about 20 minutes 3-4 times a week. Does report a family history of prostate cancer in his maternal uncle and paternal grandfather.  Neither at a young age.  No family history of colon cancer. Tetanus vaccine and flu vaccine up-to-date. No tobacco use.  No alcohol use.  No illicit drug use. Sees a dentist.  Does not see an ophthalmologist.  Spider veins: He reports spider veins in his bilateral foot and ankle area.  No pain from these.  No swelling.  Right shoulder pain: Patient notes this has been going on for a while since he has started working at home.  He is sitting relatively far away from his computer desk given that the desk is not deep and he is having to hold his arm up while  working.  Notes the pain occurs after sitting and persists for a while though does eventually go away once he is up moving around.  It is posterior pain.  No movement related discomfort.  No radiation.  No numbness.  No weakness.  Dry skin: Patient notes a number of months ago he stopped using soap on his body and hair to help reduce the dry itchy skin.  That has worked quite well.  He has not noticed any body odor and no one has complained about body odor.  He does wash his hands with soap and water.  He wonders if it is okay to not use soap.   ROS: See pertinent positives and negatives per HPI.  Past Medical History:  Diagnosis Date  . Hx of dysplastic nevus 10/17/2015   multiple sites   . Seasonal allergies     Past Surgical History:  Procedure Laterality Date  . NO PAST SURGERIES      Family History  Problem Relation Age of Onset  . Mental illness Mother   . Hyperlipidemia Father   . Hypertension Father   . Hyperlipidemia Sister   . Cancer Maternal Grandmother        uterus  . Cancer Paternal Grandfather        prostate, bone and lung cancer  . Prostate cancer Maternal Uncle   . Diabetes Neg Hx     SOCIAL HX: Non-smoker   Current Outpatient Medications:  .  Clotrimazole 1 % OINT, , Disp: , Rfl:   EXAM:  VITALS per patient if  applicable:  GENERAL: alert, oriented, appears well and in no acute distress  HEENT: atraumatic, conjunttiva clear, no obvious abnormalities on inspection of external nose and ears  NECK: normal movements of the head and neck  LUNGS: on inspection no signs of respiratory distress, breathing rate appears normal, no obvious gross SOB, gasping or wheezing  CV: no obvious cyanosis  MS: moves all visible extremities without noticeable abnormality  PSYCH/NEURO: pleasant and cooperative, no obvious depression or anxiety, speech and thought processing grossly intact  ASSESSMENT AND PLAN:  Discussed the following assessment and  plan:  Encounter for general adult medical examination with abnormal findings Physical exam completed.  Encouraged increasing exercise and monitoring diet with decreasing sugary drink intake.  Discussed prostate cancer screening start around age 73.  Patient will come in for labs sometime in the next several months.  Right shoulder pain I suspect this is positionally related.  We will have him complete exercises at home.  He will monitor.  I did suggest altering his desk set up if possible.  Spider veins Asymptomatic.  Discussed these are typically just cosmetic.  If he develops any symptoms he will let us know.  He will monitor for now.  Dry skin Seems to have resolved with stopping use of soap.  Discussed that it should be okay for him to use soap couple of times a week.  He will monitor.    I discussed the assessment and treatment plan with the patient. The patient was provided an opportunity to ask questions and all were answered. The patient agreed with the plan and demonstrated an understanding of the instructions.   The patient was advised to call back or seek an in-person evaluation if the symptoms worsen or if the condition fails to improve as anticipated.   I did discuss that there would be 2 charges placed for this visit given that we completed a physical and discussed not in physical related medical issues.  The patient agreed to proceed.  [name], MD

## 2019-06-02 NOTE — Assessment & Plan Note (Signed)
Physical exam completed.  Encouraged increasing exercise and monitoring diet with decreasing sugary drink intake.  Discussed prostate cancer screening start around age 38.  Patient will come in for labs sometime in the next several months.

## 2019-06-03 ENCOUNTER — Telehealth: Payer: Self-pay | Admitting: Family Medicine

## 2019-06-03 DIAGNOSIS — Z13220 Encounter for screening for lipoid disorders: Secondary | ICD-10-CM

## 2019-06-03 DIAGNOSIS — Z130 Encounter for screening for diseases of the blood and blood-forming organs and certain disorders involving the immune mechanism: Secondary | ICD-10-CM

## 2019-06-03 DIAGNOSIS — E663 Overweight: Secondary | ICD-10-CM

## 2019-06-03 DIAGNOSIS — Z1329 Encounter for screening for other suspected endocrine disorder: Secondary | ICD-10-CM

## 2019-06-03 NOTE — Telephone Encounter (Signed)
Per checkout note pt needed labs.. no labs in system

## 2019-06-04 NOTE — Telephone Encounter (Signed)
Ordered

## 2019-08-20 DIAGNOSIS — D2230 Melanocytic nevi of unspecified part of face: Secondary | ICD-10-CM | POA: Diagnosis not present

## 2019-08-20 DIAGNOSIS — Z86018 Personal history of other benign neoplasm: Secondary | ICD-10-CM | POA: Diagnosis not present

## 2019-08-20 DIAGNOSIS — D1801 Hemangioma of skin and subcutaneous tissue: Secondary | ICD-10-CM | POA: Diagnosis not present

## 2019-08-20 DIAGNOSIS — D229 Melanocytic nevi, unspecified: Secondary | ICD-10-CM | POA: Diagnosis not present

## 2019-09-03 ENCOUNTER — Other Ambulatory Visit: Payer: BC Managed Care – PPO

## 2019-09-03 ENCOUNTER — Other Ambulatory Visit: Payer: Self-pay

## 2019-09-03 DIAGNOSIS — Z13220 Encounter for screening for lipoid disorders: Secondary | ICD-10-CM

## 2019-09-03 DIAGNOSIS — Z130 Encounter for screening for diseases of the blood and blood-forming organs and certain disorders involving the immune mechanism: Secondary | ICD-10-CM

## 2019-09-03 DIAGNOSIS — Z1329 Encounter for screening for other suspected endocrine disorder: Secondary | ICD-10-CM

## 2019-09-03 DIAGNOSIS — E663 Overweight: Secondary | ICD-10-CM

## 2019-09-08 ENCOUNTER — Telehealth: Payer: Self-pay | Admitting: Family Medicine

## 2019-09-08 ENCOUNTER — Telehealth: Payer: Self-pay

## 2019-09-08 ENCOUNTER — Other Ambulatory Visit: Payer: No Typology Code available for payment source

## 2019-09-08 NOTE — Telephone Encounter (Signed)
Noted  

## 2019-09-08 NOTE — Telephone Encounter (Signed)
Im am sorry I will update his orders. I was unable to collect any blood work on this gentlemen. He had passed out on the day of his lab work and was severely dehydrated.

## 2019-09-08 NOTE — Addendum Note (Signed)
Addended by: Tor Netters I on: 09/08/2019 01:54 PM   Modules accepted: Orders

## 2019-09-08 NOTE — Telephone Encounter (Signed)
This patient had labs collected on 3/25 and they have not resulted. Can you check on this with our lab?

## 2019-09-08 NOTE — Telephone Encounter (Signed)
Noted. Keith Cummings, can you please contact him regarding the report that he passed on on 09-14-2022. Can you get details on this?

## 2019-09-08 NOTE — Telephone Encounter (Signed)
I called the patient and LVM for the patient to call back for details of his lab experience.  Keith Cummings,cma

## 2019-09-08 NOTE — Telephone Encounter (Signed)
Could you possibly document information from this incident

## 2019-09-08 NOTE — Telephone Encounter (Signed)
Patient did not faint he became weak and felt like he might  and had to ly down he  said this happens to him when he has blood drawn or sees blood. Sorry I thought these labs were removed and documented earlier ., BP was 113/68 and 80 when patient left facility.

## 2019-09-08 NOTE — Telephone Encounter (Signed)
Late entry: Patient had labs drawn on 09-03-19; Patient became weak and dizzy after labs. He had to rest afterwards.

## 2019-10-17 ENCOUNTER — Ambulatory Visit: Payer: No Typology Code available for payment source | Attending: Internal Medicine

## 2019-10-17 DIAGNOSIS — Z23 Encounter for immunization: Secondary | ICD-10-CM

## 2019-10-17 NOTE — Progress Notes (Signed)
   Covid-19 Vaccination Clinic  Name:  Keith Cummings    MRN: [id number] DOB: 03/15/1981  10/17/2019  [name] was observed post Covid-19 immunization for 15 minutes without incident. He was provided with Vaccine Information Sheet and instruction to access the V-Safe system.   [name] was instructed to call 911 with any severe reactions post vaccine: Marland Kitchen Difficulty breathing  . Swelling of face and throat  . A fast heartbeat  . A bad rash all over body  . Dizziness and weakness   Immunizations Administered    Name Date Dose VIS Date Route   Pfizer COVID-19 Vaccine 10/17/2019 11:41 AM 0.3 mL 08/05/2018 Intramuscular   Manufacturer: [place]   Lot: [id number]   [place]: [id number]

## 2019-11-10 ENCOUNTER — Ambulatory Visit: Payer: No Typology Code available for payment source | Attending: Internal Medicine

## 2019-11-10 DIAGNOSIS — Z23 Encounter for immunization: Secondary | ICD-10-CM

## 2019-11-10 NOTE — Progress Notes (Signed)
   Covid-19 Vaccination Clinic  Name:  Keith Cummings    MRN: [id number] DOB: 10-09-1980  11/10/2019  [name] was observed post Covid-19 immunization for 15 minutes without incident. He was provided with Vaccine Information Sheet and instruction to access the V-Safe system.   [name] was instructed to call 911 with any severe reactions post vaccine: Marland Kitchen Difficulty breathing  . Swelling of face and throat  . A fast heartbeat  . A bad rash all over body  . Dizziness and weakness   Immunizations Administered    Name Date Dose VIS Date Route   Pfizer COVID-19 Vaccine 11/10/2019  3:36 PM 0.3 mL 08/05/2018 Intramuscular   Manufacturer: [place]   Lot: [id number]   [place]: [id number]

## 2020-03-15 ENCOUNTER — Other Ambulatory Visit: Payer: Self-pay

## 2020-03-15 ENCOUNTER — Telehealth (INDEPENDENT_AMBULATORY_CARE_PROVIDER_SITE_OTHER): Payer: BC Managed Care – PPO | Admitting: Family Medicine

## 2020-03-15 ENCOUNTER — Encounter: Payer: Self-pay | Admitting: Family Medicine

## 2020-03-15 DIAGNOSIS — E781 Pure hyperglyceridemia: Secondary | ICD-10-CM

## 2020-03-15 DIAGNOSIS — Z0289 Encounter for other administrative examinations: Secondary | ICD-10-CM | POA: Diagnosis not present

## 2020-03-15 NOTE — Progress Notes (Signed)
Virtual Visit via video Note  This visit type was conducted due to national recommendations for restrictions regarding the COVID-19 pandemic (e.g. social distancing).  This format is felt to be most appropriate for this patient at this time.  All issues noted in this document were discussed and addressed.  No physical exam was performed (except for noted visual exam findings with Video Visits).   I connected with Keith Cummings today at 11:30 AM EDT by a video enabled telemedicine application and verified that I am speaking with the correct person using two identifiers. Location patient: home Location provider: home office Persons participating in the virtual visit: patient, provider  I discussed the limitations, risks, security and privacy concerns of performing an evaluation and management service by telephone and the availability of in person appointments. I also discussed with the patient that there may be a patient responsible charge related to this service. The patient expressed understanding and agreed to proceed.  Reason for visit: Follow-up.  HPI: Hypertriglyceridemia: Patient walks 20 minutes daily for exercise.  He has been cutting down on soda and sweet tea.  Eats a mix of foods.  Occasional fast food.  He typically has meat and vegetables for dinner.  Mostly drinking water at dinner.  Form completion: Patient has a form to be a foster parent that needs to be filled out.  His cholesterol is really his only chronic medical issue.  He has no risk factors for TB.  No psychiatric illnesses.  No physical limitations.  He has not checked his blood pressure in quite some time.  No known communicable diseases.   ROS: See pertinent positives and negatives per HPI.  Past Medical History:  Diagnosis Date  . Hx of dysplastic nevus 10/17/2015   multiple sites   . Seasonal allergies     Past Surgical History:  Procedure Laterality Date  . NO PAST SURGERIES      Family History    Problem Relation Age of Onset  . Mental illness Mother   . Hyperlipidemia Father   . Hypertension Father   . Hyperlipidemia Sister   . Cancer Maternal Grandmother        uterus  . Cancer Paternal Grandfather        prostate, bone and lung cancer  . Prostate cancer Maternal Uncle   . Diabetes Neg Hx     SOCIAL HX: Non-smoker   Current Outpatient Medications:  .  Clotrimazole 1 % OINT, , Disp: , Rfl:   EXAM:  VITALS per patient if applicable:  GENERAL: alert, oriented, appears well and in no acute distress  HEENT: atraumatic, conjunttiva clear, no obvious abnormalities on inspection of external nose and ears  NECK: normal movements of the head and neck  LUNGS: on inspection no signs of respiratory distress, breathing rate appears normal, no obvious gross SOB, gasping or wheezing  CV: no obvious cyanosis  MS: moves all visible extremities without noticeable abnormality  PSYCH/NEURO: pleasant and cooperative, no obvious depression or anxiety, speech and thought processing grossly intact  ASSESSMENT AND PLAN:  Discussed the following assessment and plan:  Hypertriglyceridemia Encouraged continued diet and exercise.  We will have him come in for labs.  Encounter for physical examination of prospective foster parent Form will be completed for the patient.  High advised he needs to have a blood pressure check completed before I can sign off on the form.  This will be arranged for the patient and once he has completed this I will sign  off on his form.   No orders of the defined types were placed in this encounter.   No orders of the defined types were placed in this encounter.    I discussed the assessment and treatment plan with the patient. The patient was provided an opportunity to ask questions and all were answered. The patient agreed with the plan and demonstrated an understanding of the instructions.   The patient was advised to call back or seek an in-person  evaluation if the symptoms worsen or if the condition fails to improve as anticipated.   [name], MD

## 2020-03-15 NOTE — Assessment & Plan Note (Signed)
Form will be completed for the patient.  High advised he needs to have a blood pressure check completed before I can sign off on the form.  This will be arranged for the patient and once he has completed this I will sign off on his form.

## 2020-03-15 NOTE — Assessment & Plan Note (Signed)
Encouraged continued diet and exercise.  We will have him come in for labs.

## 2020-03-17 ENCOUNTER — Telehealth: Payer: Self-pay

## 2020-03-17 NOTE — Telephone Encounter (Signed)
lvm for pt to call back to schedule 1 wk lab and bp check-pt also needs physical in Jan.

## 2020-03-24 ENCOUNTER — Ambulatory Visit: Payer: No Typology Code available for payment source

## 2020-03-29 ENCOUNTER — Ambulatory Visit (INDEPENDENT_AMBULATORY_CARE_PROVIDER_SITE_OTHER): Payer: BC Managed Care – PPO

## 2020-03-29 ENCOUNTER — Other Ambulatory Visit: Payer: Self-pay

## 2020-03-29 VITALS — BP 108/69 | HR 79

## 2020-03-29 DIAGNOSIS — Z0289 Encounter for other administrative examinations: Secondary | ICD-10-CM

## 2020-03-29 DIAGNOSIS — Z23 Encounter for immunization: Secondary | ICD-10-CM

## 2020-03-29 NOTE — Progress Notes (Signed)
Patient is here for a BP check due to bp being needed for paperworkt, as per patient.  Currently patients BP is 108/69 and BPM is 79.  Patient has no complaints of headaches, blurry vision, chest pain, arm pain, light headedness, dizziness, and nor jaw pain. Please see previous note for order.

## 2020-03-31 NOTE — Progress Notes (Signed)
Agree Dr. Olivia Mackie McLean-scocuzza

## 2020-04-08 ENCOUNTER — Telehealth: Payer: Self-pay

## 2020-04-11 ENCOUNTER — Telehealth: Payer: Self-pay | Admitting: Family Medicine

## 2020-04-11 NOTE — Telephone Encounter (Signed)
Patient called in about paperwork wanted it uploaded to mychart he does not have a fax machine  You can email it she stated

## 2020-04-12 NOTE — Telephone Encounter (Signed)
Patient picked up the form needed.  Keith Cummings,cma

## 2020-06-16 ENCOUNTER — Other Ambulatory Visit: Payer: Self-pay

## 2020-06-20 ENCOUNTER — Encounter: Payer: Self-pay | Admitting: Family Medicine

## 2020-06-20 ENCOUNTER — Ambulatory Visit (INDEPENDENT_AMBULATORY_CARE_PROVIDER_SITE_OTHER): Payer: BC Managed Care – PPO | Admitting: Family Medicine

## 2020-06-20 ENCOUNTER — Other Ambulatory Visit: Payer: Self-pay

## 2020-06-20 VITALS — BP 118/80 | HR 80 | Temp 97.6°F | Ht 70.5 in | Wt 197.2 lb

## 2020-06-20 DIAGNOSIS — E663 Overweight: Secondary | ICD-10-CM | POA: Diagnosis not present

## 2020-06-20 DIAGNOSIS — Z0001 Encounter for general adult medical examination with abnormal findings: Secondary | ICD-10-CM

## 2020-06-20 DIAGNOSIS — Z23 Encounter for immunization: Secondary | ICD-10-CM | POA: Diagnosis not present

## 2020-06-20 DIAGNOSIS — M799 Soft tissue disorder, unspecified: Secondary | ICD-10-CM | POA: Insufficient documentation

## 2020-06-20 DIAGNOSIS — H539 Unspecified visual disturbance: Secondary | ICD-10-CM | POA: Insufficient documentation

## 2020-06-20 DIAGNOSIS — E781 Pure hyperglyceridemia: Secondary | ICD-10-CM

## 2020-06-20 DIAGNOSIS — I781 Nevus, non-neoplastic: Secondary | ICD-10-CM | POA: Diagnosis not present

## 2020-06-20 DIAGNOSIS — J358 Other chronic diseases of tonsils and adenoids: Secondary | ICD-10-CM | POA: Diagnosis not present

## 2020-06-20 LAB — COMPREHENSIVE METABOLIC PANEL
ALT: 37 U/L (ref 0–53)
AST: 22 U/L (ref 0–37)
Albumin: 4.9 g/dL (ref 3.5–5.2)
Alkaline Phosphatase: 19 U/L — ABNORMAL LOW (ref 39–117)
BUN: 14 mg/dL (ref 6–23)
CO2: 32 mEq/L (ref 19–32)
Calcium: 10 mg/dL (ref 8.4–10.5)
Chloride: 101 mEq/L (ref 96–112)
Creatinine, Ser: 0.92 mg/dL (ref 0.40–1.50)
GFR: 106.38 mL/min (ref >60.00)
Glucose, Bld: 85 mg/dL (ref 70–99)
Potassium: 4.1 mEq/L (ref 3.5–5.1)
Sodium: 138 mEq/L (ref 135–145)
Total Bilirubin: 0.5 mg/dL (ref 0.2–1.2)
Total Protein: 7.6 g/dL (ref 6.0–8.3)

## 2020-06-20 LAB — LDL CHOLESTEROL, DIRECT: Direct LDL: 145 mg/dL

## 2020-06-20 LAB — LIPID PANEL
Cholesterol: 216 mg/dL — ABNORMAL HIGH (ref 0–200)
HDL: 32.5 mg/dL — ABNORMAL LOW (ref >39.00)
NonHDL: 183.68
Total CHOL/HDL Ratio: 7
Triglycerides: 220 mg/dL — ABNORMAL HIGH (ref 0.0–149.0)
VLDL: 44 mg/dL — ABNORMAL HIGH (ref 0.0–40.0)

## 2020-06-20 LAB — HEMOGLOBIN A1C: Hgb A1c MFr Bld: 5.4 % (ref 4.6–6.5)

## 2020-06-20 NOTE — Patient Instructions (Signed)
Nice to see you. We will get you referred to ENT. Please contact an optometrist to schedule evaluation for your vision. I have ordered labs.  We will contact you with the results.

## 2020-06-20 NOTE — Progress Notes (Addendum)
[name], MD Phone: [phone]  Keith Cummings is a 40 y.o. male who presents today for CPE.  Diet: Generally healthy, does eat more sweets at night.  Sweet tea 3 times a week. exercise: 3 times a week, mostly playing basketball with his son Colonoscopy: Not indicated Prostate cancer screening: To start at age 39 Family history-  Prostate cancer: Maternal uncle, paternal grandfather  Colon cancer: No Vaccines-   Flu: Up-to-date  Tetanus: Due  COVID19: Up-to-date HIV screening: Previously donated blood Hep C Screening: Previously donated blood Tobacco use: No Alcohol use: 1 beverage every 2 weeks Illicit Drug use: No Dentist: Yes Ophthalmology: No  Vision difficulty: Patient notes at times he feels like he needs to focus more to see things on his computer screen.  No issues looking at his phone.  Tonsil stones: These are chronic ongoing issues.  He notes the city came out and checked his water and they noted no significant mineral buildup.  The patient has seen ENT before though he is unsure if he discussed the tonsil stones with them.  Varicose veins: These are chronic issues.  There is no associated pain.  He wants to make sure that they look okay.  Left pinky lesion: This has been present for 8 to 10 years.  It started after he hit the pinky PIP joint on something.  He saw dermatology and notes they initially felt it was a wart though at follow-up they noted it was unlikely to be that.  It has not changed.  It does not significantly bother him.   Active Ambulatory Problems    Diagnosis Date Noted  . Seasonal allergies 12/20/2014  . Wrist pain 06/16/2015  . Encounter for general adult medical examination with abnormal findings 01/02/2016  . Orthostasis 03/26/2017  . Snoring 09/20/2017  . Tonsil stone 07/22/2018  . Multiple nevi 07/22/2018  . Overweight (BMI 25.0-29.9) 07/22/2018  . Hypertriglyceridemia 07/22/2018  . Chest discomfort 09/25/2018  . Right  shoulder pain 06/02/2019  . Spider veins 06/02/2019  . Dry skin 06/02/2019  . Encounter for physical examination of prospective foster parent 03/15/2020  . Vision changes 06/20/2020  . Soft tissue lesion 06/20/2020   Resolved Ambulatory Problems    Diagnosis Date Noted  . Tinea pedis 08/05/2012  . Wart 08/05/2012  . General medical exam 08/05/2012  . Chronic intermittant mild left flank pain 11/12/2013  . Routine general medical examination at a health care facility 12/18/2013  . Screening for skin cancer 12/18/2013  . Pre-syncope 01/02/2016  . Urticaria 03/28/2016  . Subacute bronchitis 12/13/2016  . Strep throat 11/05/2017   Past Medical History:  Diagnosis Date  . Hx of dysplastic nevus 10/17/2015    Family History  Problem Relation Age of Onset  . Mental illness Mother   . Hyperlipidemia Father   . Hypertension Father   . Hyperlipidemia Sister   . Cancer Maternal Grandmother        uterus  . Cancer Paternal Grandfather        prostate, bone and lung cancer  . Prostate cancer Maternal Uncle   . Diabetes Neg Hx     Social History   Socioeconomic History  . Marital status: Married    Spouse name: Not on file  . Number of children: Not on file  . Years of education: Not on file  . Highest education level: Not on file  Occupational History  . Not on file  Tobacco Use  . Smoking status: Never Smoker  .  Smokeless tobacco: Never Used  Substance and Sexual Activity  . Alcohol use: Yes    Alcohol/week: 0.0 standard drinks    Comment: Does not drink regularly. Just has occasional beer possibly every couple of months.  . Drug use: No  . Sexual activity: Yes    Partners: Female    Comment: Wife  Other Topics Concern  . Not on file  Social History Narrative   Works at [employer] in [place] with wife and no children    Pets: 1 dog and 1 snake    Caffeine- 1-2 cups of coffee/soda/tea daily    Enjoys movies, computer games, reading    Social  Determinants of Health   Financial Resource Strain: Not on file  Food Insecurity: Not on file  Transportation Needs: Not on file  Physical Activity: Not on file  Stress: Not on file  Social Connections: Not on file  Intimate Partner Violence: Not on file    ROS  General:  Negative for nexplained weight loss, fever Skin: Positive for skin lesion that will not heal, negative for new or changing mole HEENT: Positive for trouble seeing, negative for trouble hearing,, ringing in ears, mouth sores, hoarseness, change in voice, dysphagia. CV:  Negative for chest pain, dyspnea, edema, palpitations Resp: Negative for cough, dyspnea, hemoptysis GI: Negative for nausea, vomiting, diarrhea, constipation, abdominal pain, melena, hematochezia. GU: Negative for dysuria, incontinence, urinary hesitance, hematuria, vaginal or penile discharge, polyuria, sexual difficulty, lumps in testicle or breasts MSK: Negative for muscle cramps or aches, joint pain or swelling Neuro: Negative for headaches, weakness, numbness, dizziness, passing out/fainting Psych: Negative for depression, anxiety, memory problems  Objective  Physical Exam Vitals:   06/20/20 0931  BP: 118/80  Pulse: 80  Temp: 97.6 F (36.4 C)  SpO2: 99%    BP Readings from Last 3 Encounters:  06/20/20 118/80  03/29/20 108/69  07/22/18 102/80   Wt Readings from Last 3 Encounters:  06/20/20 197 lb 3.2 oz (89.4 kg)  03/15/20 185 lb (83.9 kg)  06/02/19 185 lb (83.9 kg)    Physical Exam Constitutional:      General: He is not in acute distress.    Appearance: He is not diaphoretic.  HENT:     Head: Normocephalic and atraumatic.  Eyes:     Conjunctiva/sclera: Conjunctivae normal.     Pupils: Pupils are equal, round, and reactive to light.  Cardiovascular:     Rate and Rhythm: Normal rate and regular rhythm.     Heart sounds: Normal heart sounds.  Pulmonary:     Effort: Pulmonary effort is normal.     Breath sounds: Normal  breath sounds.  Abdominal:     General: Bowel sounds are normal. There is no distension.     Palpations: Abdomen is soft.     Tenderness: There is no abdominal tenderness. There is no guarding or rebound.  Musculoskeletal:        General: No edema.     Right lower leg: No edema.     Left lower leg: No edema.     Comments: Very mild varicose veins near his ankles bilaterally, no apparent tenderness, there is a soft tissue swelling along the medial portion of his dorsal left fifth PIP joint, no tenderness  Lymphadenopathy:     Cervical: No cervical adenopathy.  Skin:    General: Skin is warm and dry.  Neurological:     Mental Status: He is alert.  Psychiatric:  Mood and Affect: Mood normal.      Assessment/Plan:   Problem List Items Addressed This Visit    Encounter for general adult medical examination with abnormal findings - Primary    Physical exam completed.  Encouraged healthy diet and exercise.  Discussed prostate cancer screening starting at age 69 given his family history.  Tetanus vaccine brought up-to-date.  Patient has donated blood previously and hepatitis C and HIV screening was done through that.  Lab work as outlined below.      Hypertriglyceridemia   Relevant Orders   Comp Met (CMET)   Lipid panel   Overweight (BMI 25.0-29.9)   Relevant Orders   HgB A1c   Soft tissue lesion    The lesion on his left pinky is potentially related to outpouching of synovial tissue.  This has not changed in years and thus he will continue to monitor.  If he notices any changes he will let us know.      Spider veins    Asymptomatic.  I encouraged him to monitor.  I encouraged him to keep his legs elevated.  If he develops any symptoms he can let us know and we can refer to vascular surgery.      Tonsil stone    Refer back to ENT.      Relevant Orders   Ambulatory referral to ENT   Vision changes    Vision screening today in the office revealed 20/20 all-around.   Encouraged him to see an optometrist to have his eyes checked.       Other Visit Diagnoses    Need for Tdap vaccination       Relevant Orders   Tdap vaccine greater than or equal to 7yo IM (Completed)      This visit occurred during the SARS-CoV-2 public health emergency.  Safety protocols were in place, including screening questions prior to the visit, additional usage of staff PPE, and extensive cleaning of exam room while observing appropriate contact time as indicated for disinfecting solutions.    [name], MD [place]

## 2020-06-20 NOTE — Assessment & Plan Note (Signed)
Vision screening today in the office revealed 20/20 all-around.  Encouraged him to see an optometrist to have his eyes checked.

## 2020-06-20 NOTE — Assessment & Plan Note (Signed)
Asymptomatic.  I encouraged him to monitor.  I encouraged him to keep his legs elevated.  If he develops any symptoms he can let us know and we can refer to vascular surgery.

## 2020-06-20 NOTE — Assessment & Plan Note (Signed)
Physical exam completed.  Encouraged healthy diet and exercise.  Discussed prostate cancer screening starting at age 38 given his family history.  Tetanus vaccine brought up-to-date.  Patient has donated blood previously and hepatitis C and HIV screening was done through that.  Lab work as outlined below.

## 2020-06-20 NOTE — Assessment & Plan Note (Signed)
The lesion on his left pinky is potentially related to outpouching of synovial tissue.  This has not changed in years and thus he will continue to monitor.  If he notices any changes he will let us know.

## 2020-06-20 NOTE — Assessment & Plan Note (Signed)
Refer back to ENT 

## 2020-06-27 ENCOUNTER — Other Ambulatory Visit: Payer: Self-pay | Admitting: Family Medicine

## 2020-06-27 DIAGNOSIS — R748 Abnormal levels of other serum enzymes: Secondary | ICD-10-CM

## 2020-06-27 NOTE — Telephone Encounter (Signed)
Labs done.  Marc Leichter,cma  

## 2020-07-06 ENCOUNTER — Ambulatory Visit (INDEPENDENT_AMBULATORY_CARE_PROVIDER_SITE_OTHER): Payer: BC Managed Care – PPO | Admitting: Otolaryngology

## 2020-07-12 ENCOUNTER — Other Ambulatory Visit: Payer: BC Managed Care – PPO

## 2020-07-13 ENCOUNTER — Other Ambulatory Visit (INDEPENDENT_AMBULATORY_CARE_PROVIDER_SITE_OTHER): Payer: BC Managed Care – PPO

## 2020-07-13 ENCOUNTER — Other Ambulatory Visit: Payer: Self-pay

## 2020-07-13 DIAGNOSIS — Z130 Encounter for screening for diseases of the blood and blood-forming organs and certain disorders involving the immune mechanism: Secondary | ICD-10-CM

## 2020-07-13 DIAGNOSIS — R748 Abnormal levels of other serum enzymes: Secondary | ICD-10-CM | POA: Diagnosis not present

## 2020-07-13 LAB — CBC
HCT: 42.3 % (ref 39.0–52.0)
Hemoglobin: 14.7 g/dL (ref 13.0–17.0)
MCHC: 34.6 g/dL (ref 30.0–36.0)
MCV: 86.9 fl (ref 78.0–100.0)
Platelets: 277 10*3/uL (ref 150.0–400.0)
RBC: 4.87 Mil/uL (ref 4.22–5.81)
RDW: 12.3 % (ref 11.5–15.5)
WBC: 7.1 10*3/uL (ref 4.0–10.5)

## 2020-07-13 LAB — VITAMIN B12: Vitamin B-12: 753 pg/mL (ref 211–911)

## 2020-07-13 LAB — TSH: TSH: 0.99 u[IU]/mL (ref 0.35–4.50)

## 2020-07-15 LAB — CERULOPLASMIN: Ceruloplasmin: 32 mg/dL (ref 18–36)

## 2020-07-15 LAB — ZINC: Zinc: 87 ug/dL (ref 60–130)

## 2020-07-19 ENCOUNTER — Encounter: Payer: Self-pay | Admitting: Family Medicine

## 2020-07-27 ENCOUNTER — Ambulatory Visit (INDEPENDENT_AMBULATORY_CARE_PROVIDER_SITE_OTHER): Payer: BC Managed Care – PPO | Admitting: Otolaryngology

## 2020-08-04 ENCOUNTER — Other Ambulatory Visit: Payer: Self-pay

## 2020-08-04 ENCOUNTER — Ambulatory Visit (INDEPENDENT_AMBULATORY_CARE_PROVIDER_SITE_OTHER): Payer: BC Managed Care – PPO | Admitting: Otolaryngology

## 2020-08-04 ENCOUNTER — Encounter (INDEPENDENT_AMBULATORY_CARE_PROVIDER_SITE_OTHER): Payer: Self-pay | Admitting: Otolaryngology

## 2020-08-04 VITALS — Temp 97.3°F

## 2020-08-04 DIAGNOSIS — J358 Other chronic diseases of tonsils and adenoids: Secondary | ICD-10-CM

## 2020-08-04 NOTE — Progress Notes (Signed)
HPI: Keith Cummings is a 40 y.o. male who presents is referred by his PCP for evaluation of frequent tonsil stones.  He feels like this is worse when he is drinking his water from home.  He states that if he drinks a lot of his house water he seems to get more tonsil stones than if he drinks bottled water.  When he develops the stones he has slight discomfort that seems to improve when the stones are extruded.  He denies any sore throat today..  Past Medical History:  Diagnosis Date  . Dysplastic nevus 02/25/2014   R mid to upper back - moderate  . Dysplastic nevus 02/25/2014   L mid back - mild   . Dysplastic nevus 04/18/2015   L mid back 3.0 cm lat to spine - mild   . Dysplastic nevus 04/18/2015   L lat waistline - moderate  . Seasonal allergies    Past Surgical History:  Procedure Laterality Date  . NO PAST SURGERIES     Social History   Socioeconomic History  . Marital status: Married    Spouse name: Not on file  . Number of children: Not on file  . Years of education: Not on file  . Highest education level: Not on file  Occupational History  . Not on file  Tobacco Use  . Smoking status: Never Smoker  . Smokeless tobacco: Never Used  Substance and Sexual Activity  . Alcohol use: Yes    Alcohol/week: 0.0 standard drinks    Comment: Does not drink regularly. Just has occasional beer possibly every couple of months.  . Drug use: No  . Sexual activity: Yes    Partners: Female    Comment: Wife  Other Topics Concern  . Not on file  Social History Narrative   Works at [employer] in [place] with wife and no children    Pets: 1 dog and 1 snake    Caffeine- 1-2 cups of coffee/soda/tea daily    Enjoys movies, computer games, reading    Social Determinants of Health   Financial Resource Strain: Not on file  Food Insecurity: Not on file  Transportation Needs: Not on file  Physical Activity: Not on file  Stress: Not on file  Social Connections: Not on  file   Family History  Problem Relation Age of Onset  . Mental illness Mother   . Hyperlipidemia Father   . Hypertension Father   . Hyperlipidemia Sister   . Cancer Maternal Grandmother        uterus  . Cancer Paternal Grandfather        prostate, bone and lung cancer  . Prostate cancer Maternal Uncle   . Diabetes Neg Hx    No Known Allergies Prior to Admission medications   Medication Sig Start Date End Date Taking? Authorizing Provider  Clotrimazole 1 % OINT  03/11/16   [provider]     Positive ROS: Otherwise negative  All other systems have been reviewed and were otherwise negative with the exception of those mentioned in the HPI and as above.  Physical Exam: Constitutional: Alert, well-appearing, no acute distress Ears: External ears without lesions or tenderness. Ear canals are clear bilaterally with intact, clear TMs.  Nasal: External nose without lesions. Septum relatively midline. Clear nasal passages Oral: Lips and gums without lesions. Tongue and palate mucosa without lesions. Posterior oropharynx clear.  Tonsils are average size 1-2+ slightly embedded and cryptic.  No obvious  tonsil stones noted on clinical exam today. Neck: No palpable adenopathy or masses Cardiac exam: Regular rate and rhythm without murmur. Respiratory: Breathing comfortably  Skin: No facial/neck lesions or rash noted.  Procedures  Assessment: Frequent tonsil stones.  Plan: Reviewed with the patient concerning oral hygiene and possibly expressing the tonsil stones with his toothbrush or Q-tips when he develops them.  I am not sure why using his house water versus bottled water causes more frequent tonsil stones but he could consider placing a filter in the water line. Briefly discussed option of tonsillectomy if he continues to have significant problems toward this.  I reviewed with him concerning the morbidity associated with tonsillectomy.  But this would be definitive treatment  for frequent tonsil stones. He will call us back if he decides to schedule this.   Radene Journey, MD   CC:

## 2020-08-25 ENCOUNTER — Encounter: Payer: BC Managed Care – PPO | Admitting: Dermatology

## 2020-09-22 ENCOUNTER — Encounter: Payer: Self-pay | Admitting: Dermatology

## 2020-09-22 ENCOUNTER — Ambulatory Visit (INDEPENDENT_AMBULATORY_CARE_PROVIDER_SITE_OTHER): Payer: BC Managed Care – PPO | Admitting: Dermatology

## 2020-09-22 ENCOUNTER — Other Ambulatory Visit: Payer: Self-pay

## 2020-09-22 DIAGNOSIS — Z86018 Personal history of other benign neoplasm: Secondary | ICD-10-CM | POA: Diagnosis not present

## 2020-09-22 DIAGNOSIS — L821 Other seborrheic keratosis: Secondary | ICD-10-CM

## 2020-09-22 DIAGNOSIS — D492 Neoplasm of unspecified behavior of bone, soft tissue, and skin: Secondary | ICD-10-CM

## 2020-09-22 DIAGNOSIS — D1800 Hemangioma unspecified site: Secondary | ICD-10-CM

## 2020-09-22 DIAGNOSIS — D485 Neoplasm of uncertain behavior of skin: Secondary | ICD-10-CM

## 2020-09-22 DIAGNOSIS — Z1283 Encounter for screening for malignant neoplasm of skin: Secondary | ICD-10-CM

## 2020-09-22 DIAGNOSIS — L708 Other acne: Secondary | ICD-10-CM | POA: Diagnosis not present

## 2020-09-22 DIAGNOSIS — D2262 Melanocytic nevi of left upper limb, including shoulder: Secondary | ICD-10-CM

## 2020-09-22 DIAGNOSIS — L814 Other melanin hyperpigmentation: Secondary | ICD-10-CM

## 2020-09-22 DIAGNOSIS — L578 Other skin changes due to chronic exposure to nonionizing radiation: Secondary | ICD-10-CM

## 2020-09-22 DIAGNOSIS — D229 Melanocytic nevi, unspecified: Secondary | ICD-10-CM

## 2020-09-22 DIAGNOSIS — D225 Melanocytic nevi of trunk: Secondary | ICD-10-CM

## 2020-09-22 NOTE — Patient Instructions (Addendum)
  Recommend using Cln Acne Wash daily, leave on for 1-2 minutes before rinsing off. This can be purchased at [employer] or online.  Or  Recommend using [employer] daily, leave on for 1-2 minutes before rinsing off. This can be purchased at [employer] or online.    Wound Care Instructions  1. Cleanse wound gently with soap and water once a day then pat dry with clean gauze. Apply a thing coat of Petrolatum (petroleum jelly, "Vaseline") over the wound (unless you have an allergy to this). We recommend that you use a new, sterile tube of Vaseline. Do not pick or remove scabs. Do not remove the yellow or white "healing tissue" from the base of the wound.  2. Cover the wound with fresh, clean, nonstick gauze and secure with paper tape. You may use Band-Aids in place of gauze and tape if the would is small enough, but would recommend trimming much of the tape off as there is often too much. Sometimes Band-Aids can irritate the skin.  3. You should call the office for your biopsy report after 1 week if you have not already been contacted.  4. If you experience any problems, such as abnormal amounts of bleeding, swelling, significant bruising, significant pain, or evidence of infection, please call the office immediately.  5. FOR ADULT SURGERY PATIENTS: If you need something for pain relief you may take 1 extra strength Tylenol (acetaminophen) AND 2 Ibuprofen (200mg  each) together every 4 hours as needed for pain. (do not take these if you are allergic to them or if you have a reason you should not take them.) Typically, you may only need pain medication for 1 to 3 days.

## 2020-09-22 NOTE — Progress Notes (Signed)
Follow-Up Visit   Subjective  Keith Cummings is a 40 y.o. male who presents for the following: Annual Exam (Mole check ). Hx of Dysplastic nevus.  The patient presents for Total-Body Skin Exam (TBSE) for skin cancer screening and mole check.  The following portions of the chart were reviewed this encounter and updated as appropriate:   Tobacco  Allergies  Meds  Problems  Med Hx  Surg Hx  Fam Hx     Review of Systems:  No other skin or systemic complaints except as noted in HPI or Assessment and Plan.  Objective  Well appearing patient in no apparent distress; mood and affect are within normal limits.  A full examination was performed including scalp, head, eyes, ears, nose, lips, neck, chest, axillae, abdomen, back, buttocks, bilateral upper extremities, bilateral lower extremities, hands, feet, fingers, toes, fingernails, and toenails. All findings within normal limits unless otherwise noted below.  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 Observe   Neoplasm of skin (3) Left lateral waistline- 0.4 cm Epidermal / dermal shaving  Informed consent: discussed and consent obtained   Timeout: patient name, date of birth, surgical site, and procedure verified   Procedure prep:  Patient was prepped and draped in usual sterile fashion Prep type:  Isopropyl alcohol Anesthesia: the lesion was anesthetized in a standard fashion   Anesthetic:  1% lidocaine w/ epinephrine 1-100,000 buffered w/ 8.4%  NaHCO3 Hemostasis achieved with: pressure, aluminum chloride and electrodesiccation   Outcome: patient tolerated procedure well   Post-procedure details: sterile dressing applied and wound care instructions given   Dressing type: bandage and petrolatum    Specimen 1 - Surgical pathology Differential Diagnosis: R/O Recurrent Dysplastic nevus  Reference to previous biopsy [id number] Check Margins: No Re pigmented macule  Right Upper Back 4 cm lat to spine Epidermal / dermal shaving  Lesion diameter (cm):  0.6 Informed consent: discussed and consent obtained   Timeout: patient name, date of birth, surgical site, and procedure verified   Procedure prep:  Patient was prepped and draped in usual sterile fashion Prep type:  Isopropyl alcohol Anesthesia: the lesion was anesthetized in a standard fashion   Anesthetic:  1% lidocaine w/ epinephrine 1-100,000 buffered w/ 8.4% NaHCO3 Hemostasis achieved with: pressure, aluminum chloride and electrodesiccation   Outcome: patient tolerated procedure well   Post-procedure details: sterile dressing applied and wound care instructions given   Dressing type: bandage and petrolatum    Specimen 2 - Surgical pathology Differential Diagnosis: R/O Dysplastic nevus   Check Margins: No 0.6 cm irregular brown macule  left posterior axillary fold Epidermal / dermal shaving  Lesion diameter (cm):  0.7 Informed consent: discussed and consent obtained   Timeout: patient name, date of birth, surgical site, and procedure verified   Procedure prep:  Patient was prepped and draped in usual sterile fashion Prep type:  Isopropyl alcohol Anesthesia: the lesion was anesthetized in a standard fashion   Anesthetic:  1% lidocaine w/ epinephrine 1-100,000  buffered w/ 8.4% NaHCO3 Hemostasis achieved with: pressure, aluminum chloride and electrodesiccation   Outcome: patient tolerated procedure well   Post-procedure details: sterile dressing applied and wound care  instructions given   Dressing type: bandage and petrolatum    Specimen 3 - Surgical pathology Differential Diagnosis: R/o dysplastic nevus   Check Margins: No 0.7 cm irregular brown macule  Other acne trunk Acne/Folliculitis  Recommend using Cln Acne Wash daily or Cln sports wash, leave on for 1-2 minutes before rinsing off. This can be purchased at [employer] or online.   Lentigines - Scattered tan macules - Due to sun exposure - Benign-appering, observe - Recommend daily broad spectrum sunscreen SPF 30+ to sun-exposed areas, reapply every 2 hours as needed. - Call for any changes  Seborrheic Keratoses - Stuck-on, waxy, tan-brown papules and/or plaques  - Benign-appearing - Discussed benign etiology and prognosis. - Observe - Call for any changes  Melanocytic Nevi - Tan-brown and/or pink-flesh-colored symmetric macules and papules - Benign appearing on exam today - Observation - Call clinic for new or changing moles - Recommend daily use of broad spectrum spf 30+ sunscreen to sun-exposed areas.   Hemangiomas - Red papules - Discussed benign nature - Observe - Call for any changes  Actinic Damage - Chronic condition, secondary to cumulative UV/sun exposure - diffuse scaly erythematous macules with underlying dyspigmentation - Recommend daily broad spectrum sunscreen SPF 30+ to sun-exposed areas, reapply every 2 hours as needed.  - Staying in the shade or wearing long sleeves, sun glasses (UVA+UVB protection) and wide brim hats (4-inch brim around the entire circumference of the hat) are also recommended for sun protection.  - Call for new or changing lesions.  History of Dysplastic Nevi Multiple see history  - No evidence of recurrence today - Recommend regular full body skin exams - Recommend daily broad spectrum sunscreen SPF 30+ to sun-exposed areas, reapply every 2 hours as needed.  - Call if any new or changing lesions are noted between office  visits  Skin cancer screening performed today.  Return in about 1 year (around 09/22/2021).  IMarye Round, CMA, am acting as scribe for Sarina Ser, MD .  Documentation: I have reviewed the above documentation for accuracy and completeness, and I agree with the above.  Sarina Ser, MD

## 2020-09-26 ENCOUNTER — Ambulatory Visit: Payer: BC Managed Care – PPO | Admitting: Dermatology

## 2020-09-27 ENCOUNTER — Encounter: Payer: Self-pay | Admitting: Dermatology

## 2020-09-28 ENCOUNTER — Telehealth: Payer: Self-pay

## 2020-09-28 NOTE — Telephone Encounter (Signed)
Patient informed of pathology results and appointment scheduled.  °

## 2020-09-28 NOTE — Telephone Encounter (Signed)
-----   Message from Ralene Bathe, MD sent at 09/28/2020 10:43 AM EDT ----- Diagnosis 1. Skin , left lateral waistline MELANOCYTIC NEVUS, INTRADERMAL TYPE, IRRITATED 2. Skin , right upper back 4 cm lat to spine DYSPLASTIC NEVUS WITH MODERATE TO SEVERE ATYPIA, CLOSE TO MARGIN, SEE DESCRIPTION 3. Skin , left posterior axillary fold, DYSPLASTIC COMPOUND NEVUS WITH MODERATE ATYPIA, LIMITED MARGINS FREE  1- benign irritated mole No further treatment needed 2- Moderate to Severe Dysplastic "close to margin" May need additional procedure Recheck next visit.  May be better to re-check in 3-6 mos (Make pt appt for 3-6 mos) 3- Moderate dysplastic Recheck next visit

## 2021-01-24 ENCOUNTER — Telehealth: Payer: Self-pay | Admitting: Family Medicine

## 2021-02-07 ENCOUNTER — Ambulatory Visit: Payer: BC Managed Care – PPO | Admitting: Family Medicine

## 2021-02-09 ENCOUNTER — Other Ambulatory Visit: Payer: Self-pay

## 2021-02-09 ENCOUNTER — Ambulatory Visit (INDEPENDENT_AMBULATORY_CARE_PROVIDER_SITE_OTHER): Payer: BC Managed Care – PPO | Admitting: Family Medicine

## 2021-02-09 DIAGNOSIS — J358 Other chronic diseases of tonsils and adenoids: Secondary | ICD-10-CM | POA: Diagnosis not present

## 2021-02-09 DIAGNOSIS — E663 Overweight: Secondary | ICD-10-CM

## 2021-02-09 NOTE — Progress Notes (Signed)
  [name], MD Phone: [phone]  Keith Cummings is a 40 y.o. male who presents today for follow-up.  Overweight: Patient has been walking for exercise.  He has cut down significantly on sugar and milk intake later in the day as he figured those were leading to his tonsil stones.  His tonsil stones have resolved and his weight has trended down.  He does have a form that is required for foster care.  This will be scanned into his chart.  Social History   Tobacco Use  Smoking Status Never  Smokeless Tobacco Never    Current Outpatient Medications on File Prior to Visit  Medication Sig Dispense Refill   Clotrimazole 1 % OINT      No current facility-administered medications on file prior to visit.     ROS see history of present illness  Objective  Physical Exam Vitals:   02/09/21 1606  BP: 115/80  Pulse: 87  Temp: 99.2 F (37.3 C)    BP Readings from Last 3 Encounters:  02/09/21 115/80  06/20/20 118/80  03/29/20 108/69   Wt Readings from Last 3 Encounters:  02/09/21 188 lb 3.2 oz (85.4 kg)  06/20/20 197 lb 3.2 oz (89.4 kg)  03/15/20 185 lb (83.9 kg)    Physical Exam Constitutional:      General: He is not in acute distress.    Appearance: He is not diaphoretic.  Cardiovascular:     Rate and Rhythm: Normal rate and regular rhythm.     Heart sounds: Normal heart sounds.  Pulmonary:     Effort: Pulmonary effort is normal.     Breath sounds: Normal breath sounds.  Skin:    General: Skin is warm and dry.  Neurological:     Mental Status: He is alert.     Assessment/Plan: Please see individual problem list.  Problem List Items Addressed This Visit     Overweight (BMI 25.0-29.9)    Encouraged continued diet and exercise.      Tonsil stone    Resolved with diet changes.       Return in about 6 months (around 08/09/2021) for Physical.  This visit occurred during the SARS-CoV-2 public health emergency.  Safety protocols were in place,  including screening questions prior to the visit, additional usage of staff PPE, and extensive cleaning of exam room while observing appropriate contact time as indicated for disinfecting solutions.    [name], MD [place]

## 2021-02-09 NOTE — Patient Instructions (Signed)
Nice to see you. Please try to continue to work on your diet changes and exercise.

## 2021-02-09 NOTE — Assessment & Plan Note (Signed)
Resolved with diet changes.

## 2021-02-09 NOTE — Assessment & Plan Note (Signed)
Encouraged continued diet and exercise. 

## 2021-02-16 ENCOUNTER — Ambulatory Visit (INDEPENDENT_AMBULATORY_CARE_PROVIDER_SITE_OTHER): Payer: BC Managed Care – PPO | Admitting: Dermatology

## 2021-02-16 ENCOUNTER — Other Ambulatory Visit: Payer: Self-pay

## 2021-02-16 DIAGNOSIS — Z86018 Personal history of other benign neoplasm: Secondary | ICD-10-CM

## 2021-02-16 DIAGNOSIS — L814 Other melanin hyperpigmentation: Secondary | ICD-10-CM

## 2021-02-16 DIAGNOSIS — D1800 Hemangioma unspecified site: Secondary | ICD-10-CM

## 2021-02-16 DIAGNOSIS — D229 Melanocytic nevi, unspecified: Secondary | ICD-10-CM

## 2021-02-16 DIAGNOSIS — L578 Other skin changes due to chronic exposure to nonionizing radiation: Secondary | ICD-10-CM

## 2021-02-16 DIAGNOSIS — Z1283 Encounter for screening for malignant neoplasm of skin: Secondary | ICD-10-CM

## 2021-02-16 DIAGNOSIS — D225 Melanocytic nevi of trunk: Secondary | ICD-10-CM

## 2021-02-16 DIAGNOSIS — L821 Other seborrheic keratosis: Secondary | ICD-10-CM

## 2021-02-16 DIAGNOSIS — D492 Neoplasm of unspecified behavior of bone, soft tissue, and skin: Secondary | ICD-10-CM

## 2021-02-16 NOTE — Patient Instructions (Addendum)
Wound Care Instructions  Cleanse wound gently with soap and water once a day then pat dry with clean gauze. Apply a thing coat of Petrolatum (petroleum jelly, "Vaseline") over the wound (unless you have an allergy to this). We recommend that you use a new, sterile tube of Vaseline. Do not pick or remove scabs. Do not remove the yellow or white "healing tissue" from the base of the wound.  Cover the wound with fresh, clean, nonstick gauze and secure with paper tape. You may use Band-Aids in place of gauze and tape if the would is small enough, but would recommend trimming much of the tape off as there is often too much. Sometimes Band-Aids can irritate the skin.  You should call the office for your biopsy report after 1 week if you have not already been contacted.  If you experience any problems, such as abnormal amounts of bleeding, swelling, significant bruising, significant pain, or evidence of infection, please call the office immediately.  FOR ADULT SURGERY PATIENTS: If you need something for pain relief you may take 1 extra strength Tylenol (acetaminophen) AND 2 Ibuprofen (200mg each) together every 4 hours as needed for pain. (do not take these if you are allergic to them or if you have a reason you should not take them.) Typically, you may only need pain medication for 1 to 3 days.   If you have any questions or concerns for your doctor, please call our main line at [phone] and press option 4 to reach your doctor's medical assistant. If no one answers, please leave a voicemail as directed and we will return your call as soon as possible. Messages left after 4 pm will be answered the following business day.   You may also send us a message via MyChart. We typically respond to MyChart messages within 1-2 business days.  For prescription refills, please ask your pharmacy to contact our office. Our fax number is [phone].  If you have an urgent issue when the clinic is closed that  cannot wait until the next business day, you can page your doctor at the number below.    Please note that while we do our best to be available for urgent issues outside of office hours, we are not available 24/7.   If you have an urgent issue and are unable to reach us, you may choose to seek medical care at your doctor's office, retail clinic, urgent care center, or emergency room.  If you have a medical emergency, please immediately call 911 or go to the emergency department.  Pager Numbers  - Dr. Kowalski: [phone]  - Dr. Moye: [phone]  - Dr. Stewart: [phone]  In the event of inclement weather, please call our main line at [phone] for an update on the status of any delays or closures.  Dermatology Medication Tips: Please keep the boxes that topical medications come in in order to help keep track of the instructions about where and how to use these. Pharmacies typically print the medication instructions only on the boxes and not directly on the medication tubes.   If your medication is too expensive, please contact our office at [phone] option 4 or send us a message through MyChart.   We are unable to tell what your co-pay for medications will be in advance as this is different depending on your insurance coverage. However, we may be able to find a substitute medication at lower cost or fill out paperwork to get insurance to cover a needed   medication.   If a prior authorization is required to get your medication covered by your insurance company, please allow us 1-2 business days to complete this process.  Drug prices often vary depending on where the prescription is filled and some pharmacies may offer cheaper prices.  The website www.goodrx.com contains coupons for medications through different pharmacies. The prices here do not account for what the cost may be with help from insurance (it may be cheaper with your insurance), but the website can give you the  price if you did not use any insurance.  - You can print the associated coupon and take it with your prescription to the pharmacy.  - You may also stop by our office during regular business hours and pick up a GoodRx coupon card.  - If you need your prescription sent electronically to a different pharmacy, notify our office through [facility] MyChart or by phone at [phone] option 4.   

## 2021-02-16 NOTE — Progress Notes (Signed)
Follow-Up Visit   Subjective  Keith Cummings is a 40 y.o. male who presents for the following: Annual Exam. The patient presents for Total-Body Skin Exam (TBSE) for skin cancer screening and mole check.   The following portions of the chart were reviewed this encounter and updated as appropriate:  Tobacco  Allergies  Meds  Problems  Med Hx  Surg Hx  Fam Hx     Review of Systems: No other skin or systemic complaints except as noted in HPI or Assessment and Plan.  Objective  Well appearing patient in no apparent distress; mood and affect are within normal limits.  A full examination was performed including scalp, head, eyes, ears, nose, lips, neck, chest, axillae, abdomen, back, buttocks, bilateral upper extremities, bilateral lower extremities, hands, feet, fingers, toes, fingernails, and toenails. All findings within normal limits unless otherwise noted below.  right mid sup of buttock 0.6 cm brown irritated papule   right sacral mid superior buttock 0.7 cm irregular brown papule   left post side Pink scar with brown center       Assessment & Plan  Neoplasm of skin (3) right mid sup of buttock  Epidermal / dermal shaving  Lesion diameter (cm):  0.6 Informed consent: discussed and consent obtained   Timeout: patient name, date of birth, surgical site, and procedure verified   Procedure prep:  Patient was prepped and draped in usual sterile fashion Prep type:  Isopropyl alcohol Anesthesia: the lesion was anesthetized in a standard fashion   Anesthetic:  1% lidocaine w/ epinephrine 1-100,000 buffered w/ 8.4% NaHCO3 Hemostasis achieved with: pressure, aluminum chloride and electrodesiccation   Outcome: patient tolerated procedure well   Post-procedure details: sterile dressing applied and wound care instructions given   Dressing type: bandage and petrolatum    Specimen 1 - Surgical pathology Differential Diagnosis: R/O Dysplastic nevus vs other   Check  Margins: No  right sacral mid superior buttock  Epidermal / dermal shaving  Lesion diameter (cm):  0.7 Informed consent: discussed and consent obtained   Timeout: patient name, date of birth, surgical site, and procedure verified   Procedure prep:  Patient was prepped and draped in usual sterile fashion Prep type:  Isopropyl alcohol Anesthesia: the lesion was anesthetized in a standard fashion   Anesthetic:  1% lidocaine w/ epinephrine 1-100,000 buffered w/ 8.4% NaHCO3 Hemostasis achieved with: pressure, aluminum chloride and electrodesiccation   Outcome: patient tolerated procedure well   Post-procedure details: sterile dressing applied and wound care instructions given   Dressing type: bandage and petrolatum    Specimen 2 - Surgical pathology Differential Diagnosis: R/O Dysplastic nevus vs other   Check Margins: No  left post side  Observe at left post side- Trauma vs scar, no history of a biopsy removed from  the left side   Skin cancer screening  Lentigines - Scattered tan macules - Due to sun exposure - Benign-appering, observe - Recommend daily broad spectrum sunscreen SPF 30+ to sun-exposed areas, reapply every 2 hours as needed. - Call for any changes  Seborrheic Keratoses - Stuck-on, waxy, tan-brown papules and/or plaques  - Benign-appearing - Discussed benign etiology and prognosis. - Observe - Call for any changes  Melanocytic Nevi - Tan-brown and/or pink-flesh-colored symmetric macules and papules - Benign appearing on exam today - Observation - Call clinic for new or changing moles - Recommend daily use of broad spectrum spf 30+ sunscreen to sun-exposed areas.   Hemangiomas - Red papules - Discussed benign nature -  Observe - Call for any changes  Actinic Damage - Chronic condition, secondary to cumulative UV/sun exposure - diffuse scaly erythematous macules with underlying dyspigmentation - Recommend daily broad spectrum sunscreen SPF 30+ to  sun-exposed areas, reapply every 2 hours as needed.  - Staying in the shade or wearing long sleeves, sun glasses (UVA+UVB protection) and wide brim hats (4-inch brim around the entire circumference of the hat) are also recommended for sun protection.  - Call for new or changing lesions.  History of Dysplastic Nevi Multiple see history  - No evidence of recurrence today - Recommend regular full body skin exams - Recommend daily broad spectrum sunscreen SPF 30+ to sun-exposed areas, reapply every 2 hours as needed.  - Call if any new or changing lesions are noted between office visits   Skin cancer screening performed today.  Return in about 1 year (around 02/16/2022) for TBSE, hx of Dysplastic nevus .  IMarye Round, CMA, am acting as scribe for Sarina Ser, MD .  Documentation: I have reviewed the above documentation for accuracy and completeness, and I agree with the above.  Sarina Ser, MD

## 2021-02-20 ENCOUNTER — Telehealth: Payer: Self-pay

## 2021-02-20 NOTE — Telephone Encounter (Signed)
-----   Message from Ralene Bathe, MD sent at 02/17/2021  6:57 PM EDT ----- Diagnosis 1. 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 Skin , right sacral mid superior buttock DYSPLASTIC COMPOUND NEVUS WITH MILD ATYPIA, DEEP MARGIN INVOLVED  1- Moderate dysplastic 2- Mild dysplastic Recheck both next visit

## 2021-02-20 NOTE — Telephone Encounter (Signed)
Advised patient of results/hd  

## 2021-02-21 ENCOUNTER — Encounter: Payer: Self-pay | Admitting: Dermatology

## 2021-06-23 ENCOUNTER — Encounter: Payer: BC Managed Care – PPO | Admitting: Family Medicine

## 2021-07-10 ENCOUNTER — Encounter: Payer: Self-pay | Admitting: Family Medicine

## 2021-07-10 ENCOUNTER — Other Ambulatory Visit: Payer: Self-pay

## 2021-07-10 ENCOUNTER — Ambulatory Visit (INDEPENDENT_AMBULATORY_CARE_PROVIDER_SITE_OTHER): Payer: BC Managed Care – PPO | Admitting: Family Medicine

## 2021-07-10 VITALS — BP 110/60 | HR 74 | Temp 97.8°F | Ht 70.0 in | Wt 186.2 lb

## 2021-07-10 DIAGNOSIS — S76212A Strain of adductor muscle, fascia and tendon of left thigh, initial encounter: Secondary | ICD-10-CM | POA: Diagnosis not present

## 2021-07-10 DIAGNOSIS — R413 Other amnesia: Secondary | ICD-10-CM

## 2021-07-10 DIAGNOSIS — E663 Overweight: Secondary | ICD-10-CM

## 2021-07-10 DIAGNOSIS — M799 Soft tissue disorder, unspecified: Secondary | ICD-10-CM

## 2021-07-10 DIAGNOSIS — E781 Pure hyperglyceridemia: Secondary | ICD-10-CM | POA: Diagnosis not present

## 2021-07-10 DIAGNOSIS — Z23 Encounter for immunization: Secondary | ICD-10-CM | POA: Diagnosis not present

## 2021-07-10 DIAGNOSIS — Z0001 Encounter for general adult medical examination with abnormal findings: Secondary | ICD-10-CM

## 2021-07-10 LAB — COMPREHENSIVE METABOLIC PANEL
ALT: 29 U/L (ref 0–53)
AST: 28 U/L (ref 0–37)
Albumin: 4.9 g/dL (ref 3.5–5.2)
Alkaline Phosphatase: 20 U/L — ABNORMAL LOW (ref 39–117)
BUN: 15 mg/dL (ref 6–23)
CO2: 30 mEq/L (ref 19–32)
Calcium: 9.8 mg/dL (ref 8.4–10.5)
Chloride: 102 mEq/L (ref 96–112)
Creatinine, Ser: 0.94 mg/dL (ref 0.40–1.50)
GFR: 102.9 mL/min (ref >60.00)
Glucose, Bld: 65 mg/dL — ABNORMAL LOW (ref 70–99)
Potassium: 3.8 mEq/L (ref 3.5–5.1)
Sodium: 141 mEq/L (ref 135–145)
Total Bilirubin: 0.6 mg/dL (ref 0.2–1.2)
Total Protein: 7.7 g/dL (ref 6.0–8.3)

## 2021-07-10 LAB — LIPID PANEL
Cholesterol: 209 mg/dL — ABNORMAL HIGH (ref 0–200)
HDL: 35.5 mg/dL — ABNORMAL LOW (ref >39.00)
LDL Cholesterol: 140 mg/dL — ABNORMAL HIGH (ref 0–99)
NonHDL: 173.69
Total CHOL/HDL Ratio: 6
Triglycerides: 170 mg/dL — ABNORMAL HIGH (ref 0.0–149.0)
VLDL: 34 mg/dL (ref 0.0–40.0)

## 2021-07-10 LAB — HEMOGLOBIN A1C: Hgb A1c MFr Bld: 5.4 % (ref 4.6–6.5)

## 2021-07-10 NOTE — Patient Instructions (Signed)
Nice to see you. Please continue to work on cutting down on sugar intake. Once your groin strain has healed you can continue with exercise. Will contact you with your lab results.

## 2021-07-10 NOTE — Assessment & Plan Note (Signed)
Physical exam completed.  I encouraged healthy diet and exercise.  Discussed prostate cancer screening starting at age 39 given his family history.  Discussed colon cancer screening starting at age 39.  Flu vaccine given today.  His other vaccines are up-to-date.  Lab work as outlined.

## 2021-07-10 NOTE — Assessment & Plan Note (Signed)
Chronic and stable.  Patient reports no significant changes.

## 2021-07-10 NOTE — Assessment & Plan Note (Signed)
Seems to be very mild and it is difficult to tell if this is just related to him not paying attention to certain things.  His mini cog test was 5/5.  Discussed that at this point he should monitor given his reassuring mini cog testing.

## 2021-07-10 NOTE — Assessment & Plan Note (Signed)
The patient appears to have suffered a mild left groin strain.  Discussed that these can be finicky and take quite a while to completely heal.  Discussed rest for at least 1 to 2 weeks and then to ease his way back into exercise.  Discussed if he developed any recurrence of symptoms when he starts back with exercise he should take more time off.  Discussed he could use heat or ice for symptom control.  Discussed adequate stretching once he is back into exercising.

## 2021-07-10 NOTE — Progress Notes (Signed)
[name], MD Phone: [phone]  Keith Cummings is a 41 y.o. male who presents today for CPE.  Diet: Generally healthy, he has been cutting down on sugars, he drinks soda or sweet tea 3-4 times a week, does have beef 1-2 times a week, does get a good amount of fruits and vegetables Exercise: Has been jogging and did join the rec soccer league Colonoscopy: not indicated Prostate cancer screening: not indicated Family history-  Prostate cancer: Maternal uncle, paternal grandfather  Colon cancer: No Sexually active: Yes with a single male partner Vaccines-   Flu: Due  Tetanus: UTD  COVID19: X3 with bivalent completed HIV screening: Previously donated blood Hep C Screening: Previously donated blood Tobacco use: No Alcohol use: 1 to 2/week Illicit Drug use: No Dentist: Yes Ophthalmology: Yes  Left groin strain: Patient notes he pulled a muscle yesterday while playing soccer.  Notes he used heat and cold on it and notes it does feel somewhat better today.  He did stretch though wonders if he did not stretch for long enough.  Memory issues: Patient reports having issues with some medium to long-term memories.  He notes his wife will recall things that he cannot recall.  Notes it does not happen all the time.  Notes its been going on for a long time.  Notes no short-term memory issues.  He does have a family history of dementia in his maternal grandmother.  She was in her late 56s per his report.  Continues to have the nodule on his left pinky that does occasionally get cracked during the winter.  It is at his PIP joint.  It has been stable for a long time.   Active Ambulatory Problems    Diagnosis Date Noted   Seasonal allergies 12/20/2014   Encounter for general adult medical examination with abnormal findings 01/02/2016   Tonsil stone 07/22/2018   Multiple nevi 07/22/2018   Overweight (BMI 25.0-29.9) 07/22/2018   Hypertriglyceridemia 07/22/2018   Spider veins  06/02/2019   Encounter for physical examination of prospective foster parent 03/15/2020   Soft tissue lesion 06/20/2020   Strain of groin, left, initial encounter 07/10/2021   Memory difficulty 07/10/2021   Resolved Ambulatory Problems    Diagnosis Date Noted   Tinea pedis 08/05/2012   Wart 08/05/2012   General medical exam 08/05/2012   Chronic intermittant mild left flank pain 11/12/2013   Routine general medical examination at a health care facility 12/18/2013   Screening for skin cancer 12/18/2013   Wrist pain 06/16/2015   Pre-syncope 01/02/2016   Urticaria 03/28/2016   Subacute bronchitis 12/13/2016   Orthostasis 03/26/2017   Snoring 09/20/2017   Strep throat 11/05/2017   Chest discomfort 09/25/2018   Right shoulder pain 06/02/2019   Dry skin 06/02/2019   Vision changes 06/20/2020   Past Medical History:  Diagnosis Date   Dysplastic nevus 02/25/2014   Dysplastic nevus 02/25/2014   Dysplastic nevus 04/18/2015   Dysplastic nevus 04/18/2015   Dysplastic nevus 09/22/2020   Dysplastic nevus 09/22/2020   Dysplastic nevus 02/16/2021   Dysplastic nevus 02/16/2021    Family History  Problem Relation Age of Onset   Mental illness Mother    Hyperlipidemia Father    Hypertension Father    Hyperlipidemia Sister    Cancer Maternal Grandmother        uterus   Cancer Paternal Grandfather        prostate, bone and lung cancer   Prostate cancer Maternal Uncle    Diabetes  Neg Hx     Social History   Socioeconomic History   Marital status: Married    Spouse name: Not on file   Number of children: Not on file   Years of education: Not on file   Highest education level: Not on file  Occupational History   Not on file  Tobacco Use   Smoking status: Never   Smokeless tobacco: Never  Substance and Sexual Activity   Alcohol use: Yes    Alcohol/week: 0.0 standard drinks    Comment: Does not drink regularly. Just has occasional beer possibly every couple of months.   Drug  use: No   Sexual activity: Yes    Partners: Female    Comment: Wife  Other Topics Concern   Not on file  Social History Narrative   Works at [employer] in [place] with wife and no children    Pets: 1 dog and 1 snake    Caffeine- 1-2 cups of coffee/soda/tea daily    Enjoys movies, computer games, reading    Social Determinants of Health   Financial Resource Strain: Not on file  Food Insecurity: Not on file  Transportation Needs: Not on file  Physical Activity: Not on file  Stress: Not on file  Social Connections: Not on file  Intimate Partner Violence: Not on file    ROS  General:  Negative for nexplained weight loss, fever Skin: Negative for new or changing mole, sore that won't heal HEENT: Negative for trouble hearing, trouble seeing, ringing in ears, mouth sores, hoarseness, change in voice, dysphagia. CV:  Negative for chest pain, dyspnea, edema, palpitations Resp: Negative for cough, dyspnea, hemoptysis GI: Negative for nausea, vomiting, diarrhea, constipation, abdominal pain, melena, hematochezia. GU: Negative for dysuria, incontinence, urinary hesitance, hematuria, vaginal or penile discharge, polyuria, sexual difficulty, lumps in testicle or breasts MSK: Positive for muscle cramps or aches, negative for joint pain or swelling Neuro: Negative for headaches, weakness, numbness, dizziness, passing out/fainting Psych: Positive for memory problems, negative for depression, anxiety  Objective  Physical Exam Vitals:   07/10/21 0939  BP: 110/60  Pulse: 74  Temp: 97.8 F (36.6 C)  SpO2: 98%    BP Readings from Last 3 Encounters:  07/10/21 110/60  02/09/21 115/80  06/20/20 118/80   Wt Readings from Last 3 Encounters:  07/10/21 186 lb 3.2 oz (84.5 kg)  02/09/21 188 lb 3.2 oz (85.4 kg)  06/20/20 197 lb 3.2 oz (89.4 kg)    Physical Exam Constitutional:      General: He is not in acute distress.    Appearance: He is not diaphoretic.  HENT:      Head: Normocephalic and atraumatic.  Eyes:     Conjunctiva/sclera: Conjunctivae normal.     Pupils: Pupils are equal, round, and reactive to light.  Cardiovascular:     Rate and Rhythm: Normal rate and regular rhythm.     Heart sounds: Normal heart sounds.  Pulmonary:     Effort: Pulmonary effort is normal.     Breath sounds: Normal breath sounds.  Abdominal:     General: Bowel sounds are normal. There is no distension.     Palpations: Abdomen is soft.     Tenderness: There is no abdominal tenderness. There is no guarding or rebound.  Musculoskeletal:     Right lower leg: No edema.     Left lower leg: No edema.     Comments: Slight discomfort in the left groin area at  the proximal medial groin, no bruising, no swelling, no palpable lesions  Lymphadenopathy:     Cervical: No cervical adenopathy.  Skin:    General: Skin is warm and dry.  Neurological:     Mental Status: He is alert.  Psychiatric:        Mood and Affect: Mood normal.     Assessment/Plan:   Problem List Items Addressed This Visit     Encounter for general adult medical examination with abnormal findings - Primary    Physical exam completed.  I encouraged healthy diet and exercise.  Discussed prostate cancer screening starting at age 64 given his family history.  Discussed colon cancer screening starting at age 39.  Flu vaccine given today.  His other vaccines are up-to-date.  Lab work as outlined.      Hypertriglyceridemia   Relevant Orders   Comp Met (CMET)   Lipid panel   Memory difficulty    Seems to be very mild and it is difficult to tell if this is just related to him not paying attention to certain things.  His mini cog test was 5/5.  Discussed that at this point he should monitor given his reassuring mini cog testing.      Overweight (BMI 25.0-29.9)   Relevant Orders   HgB A1c   Soft tissue lesion    Chronic and stable.  Patient reports no significant changes.      Strain of groin, left,  initial encounter    The patient appears to have suffered a mild left groin strain.  Discussed that these can be finicky and take quite a while to completely heal.  Discussed rest for at least 1 to 2 weeks and then to ease his way back into exercise.  Discussed if he developed any recurrence of symptoms when he starts back with exercise he should take more time off.  Discussed he could use heat or ice for symptom control.  Discussed adequate stretching once he is back into exercising.      Other Visit Diagnoses     Need for immunization against influenza       Relevant Orders   Flu Vaccine QUAD 20moIM (Fluarix, Fluzone & Alfiuria Quad PF) (Completed)       Return in about 1 year (around 07/10/2022) for CPE.  This visit occurred during the SARS-CoV-2 public health emergency.  Safety protocols were in place, including screening questions prior to the visit, additional usage of staff PPE, and extensive cleaning of exam room while observing appropriate contact time as indicated for disinfecting solutions.    ETommi Rumps MD [place]

## 2021-09-28 ENCOUNTER — Encounter: Payer: BC Managed Care – PPO | Admitting: Dermatology

## 2022-03-01 ENCOUNTER — Ambulatory Visit (INDEPENDENT_AMBULATORY_CARE_PROVIDER_SITE_OTHER): Payer: BC Managed Care – PPO | Admitting: Dermatology

## 2022-03-01 DIAGNOSIS — Z1283 Encounter for screening for malignant neoplasm of skin: Secondary | ICD-10-CM | POA: Diagnosis not present

## 2022-03-01 DIAGNOSIS — Z86018 Personal history of other benign neoplasm: Secondary | ICD-10-CM

## 2022-03-01 DIAGNOSIS — D235 Other benign neoplasm of skin of trunk: Secondary | ICD-10-CM

## 2022-03-01 DIAGNOSIS — Z79899 Other long term (current) drug therapy: Secondary | ICD-10-CM

## 2022-03-01 DIAGNOSIS — D225 Melanocytic nevi of trunk: Secondary | ICD-10-CM | POA: Diagnosis not present

## 2022-03-01 DIAGNOSIS — B353 Tinea pedis: Secondary | ICD-10-CM

## 2022-03-01 DIAGNOSIS — D239 Other benign neoplasm of skin, unspecified: Secondary | ICD-10-CM

## 2022-03-01 DIAGNOSIS — D1801 Hemangioma of skin and subcutaneous tissue: Secondary | ICD-10-CM

## 2022-03-01 DIAGNOSIS — L578 Other skin changes due to chronic exposure to nonionizing radiation: Secondary | ICD-10-CM

## 2022-03-01 DIAGNOSIS — D229 Melanocytic nevi, unspecified: Secondary | ICD-10-CM

## 2022-03-01 DIAGNOSIS — L814 Other melanin hyperpigmentation: Secondary | ICD-10-CM

## 2022-03-01 DIAGNOSIS — L821 Other seborrheic keratosis: Secondary | ICD-10-CM

## 2022-03-01 MED ORDER — TERBINAFINE HCL 250 MG PO TABS: 250.0000 mg | ORAL_TABLET | Freq: Every day | ORAL | 0 refills | Status: DC

## 2022-03-01 NOTE — Progress Notes (Signed)
Follow-Up Visit   Subjective  Keith Cummings is a 41 y.o. male who presents for the following: Total body skin exam (Hx of Dysplastic Nevi). The patient presents for Total-Body Skin Exam (TBSE) for skin cancer screening and mole check.  The patient has spots, moles and lesions to be evaluated, some may be new or changing and the patient has concerns that these could be cancer.   The following portions of the chart were reviewed this encounter and updated as appropriate:   Tobacco  Allergies  Meds  Problems  Med Hx  Surg Hx  Fam Hx     Review of Systems:  No other skin or systemic complaints except as noted in HPI or Assessment and Plan.  Objective  Well appearing patient in no apparent distress; mood and affect are within normal limits.  A full examination was performed including scalp, head, eyes, ears, nose, lips, neck, chest, axillae, abdomen, back, buttocks, bilateral upper extremities, bilateral lower extremities, hands, feet, fingers, toes, fingernails, and toenails. All findings within normal limits unless otherwise noted below.  back, R lat abdomen Slightly irregular brown macules back, R lat abdomen   Assessment & Plan   History of Dysplastic Nevi - No evidence of recurrence today - Recommend regular full body skin exams - Recommend daily broad spectrum sunscreen SPF 30+ to sun-exposed areas, reapply every 2 hours as needed.  - Call if any new or changing lesions are noted between office visits  - multiple  Lentigines - Scattered tan macules - Due to sun exposure - Benign-appearing, observe - Recommend daily broad spectrum sunscreen SPF 30+ to sun-exposed areas, reapply every 2 hours as needed. - Call for any changes - shoulders  Seborrheic Keratoses - Stuck-on, waxy, tan-brown papules and/or plaques  - Benign-appearing - Discussed benign etiology and prognosis. - Observe - Call for any changes  Melanocytic Nevi - Tan-brown and/or pink-flesh-colored  symmetric macules and papules - Benign appearing on exam today - Observation - Call clinic for new or changing moles - Recommend daily use of broad spectrum spf 30+ sunscreen to sun-exposed areas.  - trunk, buttocks  Hemangiomas - Red papules - Discussed benign nature - Observe - Call for any changes - trunk  Actinic Damage - Chronic condition, secondary to cumulative UV/sun exposure - diffuse scaly erythematous macules with underlying dyspigmentation - Recommend daily broad spectrum sunscreen SPF 30+ to sun-exposed areas, reapply every 2 hours as needed.  - Staying in the shade or wearing long sleeves, sun glasses (UVA+UVB protection) and wide brim hats (4-inch brim around the entire circumference of the hat) are also recommended for sun protection.  - Call for new or changing lesions.  Skin cancer screening performed today.   Nevus back, R lat abdomen Benign-appearing.  Observation.  Call clinic for new or changing moles.  Recommend daily use of broad spectrum spf 30+ sunscreen to sun-exposed areas.    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 Condition is symptomatic / bothersome to patient. Not to goal.  Discussed topical and oral treatment Labs from 06/2021 viewed, wnl  Start Lamisil '250mg'$  1 po qd #30 0rf  Terbinafine Counseling  Terbinafine is an anti-fungal medicine that can be applied to the skin (over the counter) or taken by mouth (prescription) to treat fungal infections. The pill version is often used to treat fungal infections of the nails or scalp. While most  people do not have any side effects from taking terbinafine pills, some possible side effects of the medicine can include taste changes, headache, loss of smell, vision changes, nausea, vomiting, or diarrhea.   Rare side effects can include irritation of the liver, allergic reaction, or  decrease in blood counts (which may show up as not feeling well or developing an infection). If you are concerned about any of these side effects, please stop the medicine and call your doctor, or in the case of an emergency such as feeling very unwell, seek immediate medical care.    terbinafine (LAMISIL) 250 MG tablet - bil feet Take 1 tablet (250 mg total) by mouth daily.  Return in about 6 weeks (around 04/12/2022) for Tinea f/u, 1 yr TBSE.  I, Othelia Pulling, RMA, am acting as scribe for Sarina Ser, MD . Documentation: I have reviewed the above documentation for accuracy and completeness, and I agree with the above.  Sarina Ser, MD

## 2022-03-01 NOTE — Patient Instructions (Addendum)
Terbinafine Counseling  Terbinafine is an anti-fungal medicine that can be applied to the skin (over the counter) or taken by mouth (prescription) to treat fungal infections. The pill version is often used to treat fungal infections of the nails or scalp. While most people do not have any side effects from taking terbinafine pills, some possible side effects of the medicine can include taste changes, headache, loss of smell, vision changes, nausea, vomiting, or diarrhea.   Rare side effects can include irritation of the liver, allergic reaction, or decrease in blood counts (which may show up as not feeling well or developing an infection). If you are concerned about any of these side effects, please stop the medicine and call your doctor, or in the case of an emergency such as feeling very unwell, seek immediate medical care.    Due to recent changes in healthcare laws, you may see results of your pathology and/or laboratory studies on MyChart before the doctors have had a chance to review them. We understand that in some cases there may be results that are confusing or concerning to you. Please understand that not all results are received at the same time and often the doctors may need to interpret multiple results in order to provide you with the best plan of care or course of treatment. Therefore, we ask that you please give us 2 business days to thoroughly review all your results before contacting the office for clarification. Should we see a critical lab result, you will be contacted sooner.   If You Need Anything After Your Visit  If you have any questions or concerns for your doctor, please call our main line at [phone] and press option 4 to reach your doctor's medical assistant. If no one answers, please leave a voicemail as directed and we will return your call as soon as possible. Messages left after 4 pm will be answered the following business day.   You may also send us a message via  MyChart. We typically respond to MyChart messages within 1-2 business days.  For prescription refills, please ask your pharmacy to contact our office. Our fax number is [phone].  If you have an urgent issue when the clinic is closed that cannot wait until the next business day, you can page your doctor at the number below.    Please note that while we do our best to be available for urgent issues outside of office hours, we are not available 24/7.   If you have an urgent issue and are unable to reach us, you may choose to seek medical care at your doctor's office, retail clinic, urgent care center, or emergency room.  If you have a medical emergency, please immediately call 911 or go to the emergency department.  Pager Numbers  - Dr. Kowalski: [phone]  - Dr. Moye: [phone]  - Dr. Stewart: [phone]  In the event of inclement weather, please call our main line at [phone] for an update on the status of any delays or closures.  Dermatology Medication Tips: Please keep the boxes that topical medications come in in order to help keep track of the instructions about where and how to use these. Pharmacies typically print the medication instructions only on the boxes and not directly on the medication tubes.   If your medication is too expensive, please contact our office at [phone] option 4 or send us a message through MyChart.   We are unable to tell what your co-pay for medications will be   in advance as this is different depending on your insurance coverage. However, we may be able to find a substitute medication at lower cost or fill out paperwork to get insurance to cover a needed medication.   If a prior authorization is required to get your medication covered by your insurance company, please allow us 1-2 business days to complete this process.  Drug prices often vary depending on where the prescription is filled and some pharmacies may offer cheaper  prices.  The website www.goodrx.com contains coupons for medications through different pharmacies. The prices here do not account for what the cost may be with help from insurance (it may be cheaper with your insurance), but the website can give you the price if you did not use any insurance.  - You can print the associated coupon and take it with your prescription to the pharmacy.  - You may also stop by our office during regular business hours and pick up a GoodRx coupon card.  - If you need your prescription sent electronically to a different pharmacy, notify our office through [place] MyChart or by phone at [phone] option 4.     Si Usted Necesita Algo Despus de Su Visita  Tambin puede enviarnos un mensaje a travs de MyChart. Por lo general respondemos a los mensajes de MyChart en el transcurso de 1 a 2 das hbiles.  Para renovar recetas, por favor pida a su farmacia que se ponga en contacto con nuestra oficina. Nuestro nmero de fax es el [phone].  Si tiene un asunto urgente cuando la clnica est cerrada y que no puede esperar hasta el siguiente da hbil, puede llamar/localizar a su doctor(a) al nmero que aparece a continuacin.   Por favor, tenga en cuenta que aunque hacemos todo lo posible para estar disponibles para asuntos urgentes fuera del horario de oficina, no estamos disponibles las 24 horas del da, los 7 das de la semana.   Si tiene un problema urgente y no puede comunicarse con nosotros, puede optar por buscar atencin mdica  en el consultorio de su doctor(a), en una clnica privada, en un centro de atencin urgente o en una sala de emergencias.  Si tiene una emergencia mdica, por favor llame inmediatamente al 911 o vaya a la sala de emergencias.  Nmeros de bper  - Dr. Kowalski: [phone]  - Dra. Moye: [phone]  - Dra. Stewart: [phone]  En caso de inclemencias del tiempo, por favor llame a nuestra lnea principal al [phone]  para una actualizacin sobre el estado de cualquier retraso o cierre.  Consejos para la medicacin en dermatologa: Por favor, guarde las cajas en las que vienen los medicamentos de uso tpico para ayudarle a seguir las instrucciones sobre dnde y cmo usarlos. Las farmacias generalmente imprimen las instrucciones del medicamento slo en las cajas y no directamente en los tubos del medicamento.   Si su medicamento es muy caro, por favor, pngase en contacto con nuestra oficina llamando al [phone] y presione la opcin 4 o envenos un mensaje a travs de MyChart.   No podemos decirle cul ser su copago por los medicamentos por adelantado ya que esto es diferente dependiendo de la cobertura de su seguro. Sin embargo, es posible que podamos encontrar un medicamento sustituto a menor costo o llenar un formulario para que el seguro cubra el medicamento que se considera necesario.   Si se requiere una autorizacin previa para que su compaa de seguros cubra su medicamento, por favor permtanos de 1 a   2 das hbiles para completar este proceso.  Los precios de los medicamentos varan con frecuencia dependiendo del lugar de dnde se surte la receta y alguna farmacias pueden ofrecer precios ms baratos.  El sitio web www.goodrx.com tiene cupones para medicamentos de diferentes farmacias. Los precios aqu no tienen en cuenta lo que podra costar con la ayuda del seguro (puede ser ms barato con su seguro), pero el sitio web puede darle el precio si no utiliz ningn seguro.  - Puede imprimir el cupn correspondiente y llevarlo con su receta a la farmacia.  - Tambin puede pasar por nuestra oficina durante el horario de atencin regular y recoger una tarjeta de cupones de GoodRx.  - Si necesita que su receta se enve electrnicamente a una farmacia diferente, informe a nuestra oficina a travs de MyChart de [place] o por telfono llamando al [phone] y presione la opcin 4.  

## 2022-03-06 ENCOUNTER — Encounter: Payer: Self-pay | Admitting: Dermatology

## 2022-04-18 ENCOUNTER — Ambulatory Visit (INDEPENDENT_AMBULATORY_CARE_PROVIDER_SITE_OTHER): Payer: BC Managed Care – PPO | Admitting: Dermatology

## 2022-04-18 DIAGNOSIS — B351 Tinea unguium: Secondary | ICD-10-CM | POA: Diagnosis not present

## 2022-04-18 DIAGNOSIS — Z79899 Other long term (current) drug therapy: Secondary | ICD-10-CM | POA: Diagnosis not present

## 2022-04-18 DIAGNOSIS — B353 Tinea pedis: Secondary | ICD-10-CM

## 2022-04-18 DIAGNOSIS — D2271 Melanocytic nevi of right lower limb, including hip: Secondary | ICD-10-CM | POA: Diagnosis not present

## 2022-04-18 DIAGNOSIS — D229 Melanocytic nevi, unspecified: Secondary | ICD-10-CM

## 2022-04-18 MED ORDER — TERBINAFINE HCL 250 MG PO TABS: 250.0000 mg | ORAL_TABLET | Freq: Every day | ORAL | 1 refills | Status: DC

## 2022-04-18 NOTE — Progress Notes (Signed)
   Follow-Up Visit   Subjective  Keith Cummings is a 41 y.o. male who presents for the following: Tinea pedis/Tinea Unguium vs Toenail dystrophy (Bil feet/toenails, 6wk f/u, Lamisil '250mg'$  x 1 month, scaling on feet cleared with Lamisil but not much improvement in toenails ). The patient has spots, moles and lesions to be evaluated, some may be new or changing and the patient has concerns that these could be cancer.  The following portions of the chart were reviewed this encounter and updated as appropriate:   Tobacco  Allergies  Meds  Problems  Med Hx  Surg Hx  Fam Hx     Review of Systems:  No other skin or systemic complaints except as noted in HPI or Assessment and Plan.  Objective  Well appearing patient in no apparent distress; mood and affect are within normal limits.  A focused examination was performed including feet. Relevant physical exam findings are noted in the Assessment and Plan.  Left Foot - Posterior Toenail dystrophy, otherwise feet clear       R sole Regular brown macules          Assessment & Plan  Tinea pedis of both feet with tinea unguium versus other toenail dystrophy Left Foot - Posterior  Scaling on feet resolved but persistent nail dystrophy  Cont Lamisil '250mg'$  1 po qd x 2 months, for a total of 3 months  Terbinafine Counseling  Terbinafine is an anti-fungal medicine that can be applied to the skin (over the counter) or taken by mouth (prescription) to treat fungal infections. The pill version is often used to treat fungal infections of the nails or scalp. While most people do not have any side effects from taking terbinafine pills, some possible side effects of the medicine can include taste changes, headache, loss of smell, vision changes, nausea, vomiting, or diarrhea.   Rare side effects can include irritation of the liver, allergic reaction, or decrease in blood counts (which may show up as not feeling well or developing an  infection). If you are concerned about any of these side effects, please stop the medicine and call your doctor, or in the case of an emergency such as feeling very unwell, seek immediate medical care.    Related Medications terbinafine (LAMISIL) 250 MG tablet Take 1 tablet (250 mg total) by mouth daily.  Nevus R sole See photos Benign-appearing.  Observation.  Call clinic for new or changing moles.  Recommend daily use of broad spectrum spf 30+ sunscreen to sun-exposed areas.    Return for as scheduled for TBSE, Hx of Dysplastic nevi.  I, Othelia Pulling, RMA, am acting as scribe for Sarina Ser, MD . Documentation: I have reviewed the above documentation for accuracy and completeness, and I agree with the above.  Sarina Ser, MD

## 2022-04-18 NOTE — Patient Instructions (Addendum)
Terbinafine Counseling  Terbinafine is an anti-fungal medicine that can be applied to the skin (over the counter) or taken by mouth (prescription) to treat fungal infections. The pill version is often used to treat fungal infections of the nails or scalp. While most people do not have any side effects from taking terbinafine pills, some possible side effects of the medicine can include taste changes, headache, loss of smell, vision changes, nausea, vomiting, or diarrhea.   Rare side effects can include irritation of the liver, allergic reaction, or decrease in blood counts (which may show up as not feeling well or developing an infection). If you are concerned about any of these side effects, please stop the medicine and call your doctor, or in the case of an emergency such as feeling very unwell, seek immediate medical care.       Due to recent changes in healthcare laws, you may see results of your pathology and/or laboratory studies on MyChart before the doctors have had a chance to review them. We understand that in some cases there may be results that are confusing or concerning to you. Please understand that not all results are received at the same time and often the doctors may need to interpret multiple results in order to provide you with the best plan of care or course of treatment. Therefore, we ask that you please give [place] 2 business days to thoroughly review all your results before contacting the office for clarification. Should we see a critical lab result, you will be contacted sooner.   If You Need Anything After Your Visit  If you have any questions or concerns for your doctor, please call our main line at [phone] and press option 4 to reach your doctor's medical assistant. If no one answers, please leave a voicemail as directed and we will return your call as soon as possible. Messages left after 4 pm will be answered the following business day.   You may also send [place] a message  via [place]. We typically respond to MyChart messages within 1-2 business days.  For prescription refills, please ask your pharmacy to contact our office. Our fax number is [phone].  If you have an urgent issue when the clinic is closed that cannot wait until the next business day, you can page your doctor at the number below.    Please note that while we do our best to be available for urgent issues outside of office hours, we are not available 24/7.   If you have an urgent issue and are unable to reach [place], you may choose to seek medical care at your doctor's office, retail clinic, urgent care center, or emergency room.  If you have a medical emergency, please immediately call 911 or go to the emergency department.  Pager Numbers  - Dr. Nehemiah Massed: [phone]  - Dr. Laurence Ferrari: [phone]  - Dr. Nicole Kindred: [phone]  In the event of inclement weather, please call our main line at [phone] for an update on the status of any delays or closures.  Dermatology Medication Tips: Please keep the boxes that topical medications come in in order to help keep track of the instructions about where and how to use these. Pharmacies typically print the medication instructions only on the boxes and not directly on the medication tubes.   If your medication is too expensive, please contact our office at [phone] option 4 or send [place] a message through [place].   We are unable to tell what your co-pay for  medications will be in advance as this is different depending on your insurance coverage. However, we may be able to find a substitute medication at lower cost or fill out paperwork to get insurance to cover a needed medication.   If a prior authorization is required to get your medication covered by your insurance company, please allow [place] 1-2 business days to complete this process.  Drug prices often vary depending on where the prescription is filled and some pharmacies may offer cheaper  prices.  The website www.goodrx.com contains coupons for medications through different pharmacies. The prices here do not account for what the cost may be with help from insurance (it may be cheaper with your insurance), but the website can give you the price if you did not use any insurance.  - You can print the associated coupon and take it with your prescription to the pharmacy.  - You may also stop by our office during regular business hours and pick up a GoodRx coupon card.  - If you need your prescription sent electronically to a different pharmacy, notify our office through [facility] or by phone at [phone] option 4.     Si Usted Necesita Algo Despus de Su Visita  Tambin puede enviarnos un mensaje a travs de Pharmacist, community. Por lo general respondemos a los mensajes de MyChart en el transcurso de 1 a 2 das hbiles.  Para renovar recetas, por favor pida a su farmacia que se ponga en contacto con nuestra oficina. Harland Dingwall de fax es [place] [phone].  Si tiene un asunto urgente cuando la clnica est cerrada y que no puede esperar hasta el siguiente da hbil, puede llamar/localizar a su doctor(a) al nmero que aparece a continuacin.   Por favor, tenga en cuenta que aunque hacemos todo lo posible para estar disponibles para asuntos urgentes fuera del horario de [place], no estamos disponibles las 24 horas del da, los 7 das de la [place].   Si tiene un problema urgente y no puede comunicarse con nosotros, puede optar por buscar atencin mdica  en el consultorio de su doctor(a), en una clnica privada, en un centro de atencin urgente o en una sala de emergencias.  Si tiene Engineering geologist, por favor llame inmediatamente al 911 o vaya a la sala de emergencias.  Nmeros de bper  - Dr. Nehemiah Massed: [phone]  - Dra. Moye: [phone]  - Dra. Nicole Kindred: [phone]  En caso de inclemencias del [place], por favor llame a Johnsie Kindred principal al [phone]  para una actualizacin sobre el [place] de cualquier retraso o cierre.  Consejos para la medicacin en dermatologa: Por favor, guarde las cajas en las que vienen los medicamentos de uso tpico para ayudarle a seguir las instrucciones sobre dnde y cmo usarlos. Las farmacias generalmente imprimen las instrucciones del medicamento slo en las cajas y no directamente en los tubos del [place].   Si su medicamento es muy caro, por favor, pngase en contacto con Zigmund Daniel llamando al [phone] y presione la opcin 4 o envenos un mensaje a travs de Pharmacist, community.   No podemos decirle cul ser su copago por los medicamentos por adelantado ya que esto es diferente dependiendo de la cobertura de su seguro. Sin embargo, es posible que podamos encontrar un medicamento sustituto a Electrical engineer un formulario para que el seguro cubra el medicamento que se considera necesario.   Si se requiere una autorizacin previa para que su compaa de seguros [place] su medicamento, por favor permtanos  de 1 a 2 das hbiles para completar este proceso.  Los precios de los medicamentos varan con frecuencia dependiendo del Environmental consultant de dnde se surte la receta y alguna farmacias pueden ofrecer precios ms baratos.  El sitio web www.goodrx.com tiene cupones para medicamentos de Airline pilot. Los precios aqu no tienen en cuenta lo que podra costar con la ayuda del seguro (puede ser ms barato con su seguro), pero el sitio web puede darle el precio si no utiliz Research scientist (physical sciences).  - Puede imprimir el cupn correspondiente y llevarlo con su receta a la farmacia.  - Tambin puede pasar por nuestra oficina durante el horario de atencin regular y Charity fundraiser una tarjeta de cupones de GoodRx.  - Si necesita que su receta se enve electrnicamente a una farmacia diferente, informe a nuestra oficina a travs de MyChart de [place] o por telfono llamando al [phone] y presione la opcin 4.

## 2022-04-27 ENCOUNTER — Encounter: Payer: Self-pay | Admitting: Dermatology

## 2022-07-10 ENCOUNTER — Ambulatory Visit (INDEPENDENT_AMBULATORY_CARE_PROVIDER_SITE_OTHER): Payer: 59 | Admitting: Family Medicine

## 2022-07-10 ENCOUNTER — Encounter: Payer: Self-pay | Admitting: Family Medicine

## 2022-07-10 VITALS — BP 100/60 | HR 81 | Temp 97.8°F | Ht 70.0 in | Wt 188.0 lb

## 2022-07-10 DIAGNOSIS — Z13220 Encounter for screening for lipoid disorders: Secondary | ICD-10-CM

## 2022-07-10 DIAGNOSIS — Z23 Encounter for immunization: Secondary | ICD-10-CM | POA: Diagnosis not present

## 2022-07-10 DIAGNOSIS — Z0000 Encounter for general adult medical examination without abnormal findings: Secondary | ICD-10-CM

## 2022-07-10 DIAGNOSIS — E663 Overweight: Secondary | ICD-10-CM

## 2022-07-10 LAB — COMPREHENSIVE METABOLIC PANEL
ALT: 25 U/L (ref 0–53)
AST: 18 U/L (ref 0–37)
Albumin: 4.8 g/dL (ref 3.5–5.2)
Alkaline Phosphatase: 18 U/L — ABNORMAL LOW (ref 39–117)
BUN: 14 mg/dL (ref 6–23)
CO2: 32 mEq/L (ref 19–32)
Calcium: 9.8 mg/dL (ref 8.4–10.5)
Chloride: 101 mEq/L (ref 96–112)
Creatinine, Ser: 0.85 mg/dL (ref 0.40–1.50)
GFR: 109.53 mL/min (ref >60.00)
Glucose, Bld: 71 mg/dL (ref 70–99)
Potassium: 4 mEq/L (ref 3.5–5.1)
Sodium: 141 mEq/L (ref 135–145)
Total Bilirubin: 0.4 mg/dL (ref 0.2–1.2)
Total Protein: 7.4 g/dL (ref 6.0–8.3)

## 2022-07-10 LAB — LIPID PANEL
Cholesterol: 224 mg/dL — ABNORMAL HIGH (ref 0–200)
HDL: 34.9 mg/dL — ABNORMAL LOW (ref >39.00)
LDL Cholesterol: 154 mg/dL — ABNORMAL HIGH (ref 0–99)
NonHDL: 189.12
Total CHOL/HDL Ratio: 6
Triglycerides: 176 mg/dL — ABNORMAL HIGH (ref 0.0–149.0)
VLDL: 35.2 mg/dL (ref 0.0–40.0)

## 2022-07-10 LAB — HEMOGLOBIN A1C: Hgb A1c MFr Bld: 5.5 % (ref 4.6–6.5)

## 2022-07-10 NOTE — Patient Instructions (Signed)
Nice to see you. We will contact you with your lab results. 

## 2022-07-10 NOTE — Assessment & Plan Note (Signed)
Physical exam completed. Encouraged healthy diet and exercise. Discussed prostate cancer screening would start with his next physical. Discussed updated covid vaccine and he will consider this. Flu vaccine given today. He will monitor for any worsening memory issues. Labs as outlined.

## 2022-07-10 NOTE — Progress Notes (Signed)
Keith Rumps, MD Phone: [phone]  Keith Cummings is a 42 y.o. male who presents today for CPE.  Diet: cut back on sugar, does have soda or sweet tea 3-5x/week, otherwise has a varied diet Exercise: runs a few times a week when it is not winter Colonoscopy: not indicated Prostate cancer screening: not indicated Family history-  Prostate cancer: maternal uncle, paternal grandfather  Colon cancer: no Vaccines-   Flu: due  Tetanus: UTD  COVID19: x2 HIV screening: blood donation in past Hep C Screening: blood donation in past Tobacco use: no Alcohol use: 1 every 2 weeks Illicit Drug use: no Dentist: yes Ophthalmology: no Noted minimal memory issues with trouble remembering little things. This is chronic and unchanged.  Has been using new moisturizers and cleansers on his face with good benefit.    Active Ambulatory Problems    Diagnosis Date Noted   Seasonal allergies 12/20/2014   Routine general medical examination at a health care facility 01/02/2016   Tonsil stone 07/22/2018   Multiple nevi 07/22/2018   Overweight (BMI 25.0-29.9) 07/22/2018   Hypertriglyceridemia 07/22/2018   Spider veins 06/02/2019   Soft tissue lesion 06/20/2020   Memory difficulty 07/10/2021   Resolved Ambulatory Problems    Diagnosis Date Noted   Tinea pedis 08/05/2012   Wart 08/05/2012   General medical exam 08/05/2012   Chronic intermittant mild left flank pain 11/12/2013   Routine general medical examination at a health care facility 12/18/2013   Screening for skin cancer 12/18/2013   Wrist pain 06/16/2015   Pre-syncope 01/02/2016   Urticaria 03/28/2016   Subacute bronchitis 12/13/2016   Orthostasis 03/26/2017   Snoring 09/20/2017   Strep throat 11/05/2017   Chest discomfort 09/25/2018   Right shoulder pain 06/02/2019   Dry skin 06/02/2019   Encounter for physical examination of prospective foster parent 03/15/2020   Vision changes 06/20/2020   Strain of groin, left,  initial encounter 07/10/2021   Past Medical History:  Diagnosis Date   Dysplastic nevus 02/25/2014   Dysplastic nevus 02/25/2014   Dysplastic nevus 04/18/2015   Dysplastic nevus 04/18/2015   Dysplastic nevus 09/22/2020   Dysplastic nevus 09/22/2020   Dysplastic nevus 02/16/2021   Dysplastic nevus 02/16/2021    Family History  Problem Relation Age of Onset   Mental illness Mother    Hyperlipidemia Father    Hypertension Father    Hyperlipidemia Sister    Cancer Maternal Grandmother        uterus   Cancer Paternal Grandfather        prostate, bone and lung cancer   Prostate cancer Maternal Uncle    Diabetes Neg Hx     Social History   Socioeconomic History   Marital status: Married    Spouse name: Not on file   Number of children: Not on file   Years of education: Not on file   Highest education level: Not on file  Occupational History   Not on file  Tobacco Use   Smoking status: Never   Smokeless tobacco: Never  Substance and Sexual Activity   Alcohol use: Yes    Alcohol/week: 0.0 standard drinks of alcohol    Comment: Does not drink regularly. Just has occasional beer possibly every couple of months.   Drug use: No   Sexual activity: Yes    Partners: Female    Comment: Wife  Other Topics Concern   Not on file  Social History Narrative   Works at [employer] in [place]  Lives with wife and no children    Pets: 1 dog and 1 snake    Caffeine- 1-2 cups of coffee/soda/tea daily    Enjoys movies, computer games, reading    Social Determinants of Health   Financial Resource Strain: Not on file  Food Insecurity: Not on file  Transportation Needs: Not on file  Physical Activity: Not on file  Stress: Not on file  Social Connections: Not on file  Intimate Partner Violence: Not on file    ROS  General:  Negative for nexplained weight loss, fever Skin: Negative for new or changing mole, sore that won't heal HEENT: Negative for trouble hearing,  trouble seeing, ringing in ears, mouth sores, hoarseness, change in voice, dysphagia. CV:  Negative for chest pain, dyspnea, edema, palpitations Resp: Negative for cough, dyspnea, hemoptysis GI: Negative for nausea, vomiting, diarrhea, constipation, abdominal pain, melena, hematochezia. GU: Negative for dysuria, incontinence, urinary hesitance, hematuria, vaginal or penile discharge, polyuria, sexual difficulty, lumps in testicle or breasts MSK: Negative for muscle cramps or aches, joint pain or swelling Neuro: Negative for headaches, weakness, numbness, dizziness, passing out/fainting Psych: Negative for depression, anxiety, memory problems  Objective  Physical Exam Vitals:   07/10/22 0908  BP: 100/60  Pulse: 81  Temp: 97.8 F (36.6 C)  SpO2: 99%    BP Readings from Last 3 Encounters:  07/10/22 100/60  07/10/21 110/60  02/09/21 115/80   Wt Readings from Last 3 Encounters:  07/10/22 188 lb (85.3 kg)  07/10/21 186 lb 3.2 oz (84.5 kg)  02/09/21 188 lb 3.2 oz (85.4 kg)    Physical Exam Constitutional:      General: He is not in acute distress.    Appearance: He is not diaphoretic.  HENT:     Head: Normocephalic and atraumatic.  Cardiovascular:     Rate and Rhythm: Normal rate and regular rhythm.     Heart sounds: Normal heart sounds.  Pulmonary:     Effort: Pulmonary effort is normal.     Breath sounds: Normal breath sounds.  Abdominal:     General: Bowel sounds are normal. There is no distension.     Palpations: Abdomen is soft.     Tenderness: There is no abdominal tenderness.  Musculoskeletal:     Right lower leg: No edema.     Left lower leg: No edema.  Lymphadenopathy:     Cervical: No cervical adenopathy.  Skin:    General: Skin is warm and dry.  Neurological:     Mental Status: He is alert.  Psychiatric:        Mood and Affect: Mood normal.      Assessment/Plan:   Routine general medical examination at a health care facility Assessment &  Plan: Physical exam completed. Encouraged healthy diet and exercise. Discussed prostate cancer screening would start with his next physical. Discussed updated covid vaccine and he will consider this. Flu vaccine given today. He will monitor for any worsening memory issues. Labs as outlined.    Overweight (BMI 25.0-29.9) -     Hemoglobin A1c; Future  Lipid screening -     Comprehensive metabolic panel; Future -     Lipid panel; Future  Need for immunization against influenza -     Flu Vaccine QUAD 62moIM (Fluarix, Fluzone & Alfiuria Quad PF)    Return in about 1 year (around 07/11/2023) for physical.   ETommi Rumps MD [place]

## 2022-11-30 ENCOUNTER — Encounter: Payer: Self-pay | Admitting: Family Medicine

## 2022-12-03 NOTE — Telephone Encounter (Signed)
Form signed. Please make available for patient to pick up.

## 2022-12-03 NOTE — Telephone Encounter (Signed)
Printed form and filled out my part and placed in Dr. Purvis Sheffield needs to be signed basket

## 2022-12-04 NOTE — Telephone Encounter (Signed)
Form placed up front in patient pick up folder.

## 2022-12-19 ENCOUNTER — Encounter: Payer: Self-pay | Admitting: Dermatology

## 2022-12-19 ENCOUNTER — Ambulatory Visit (INDEPENDENT_AMBULATORY_CARE_PROVIDER_SITE_OTHER): Payer: 59 | Admitting: Dermatology

## 2022-12-19 VITALS — BP 120/72 | HR 75

## 2022-12-19 DIAGNOSIS — B070 Plantar wart: Secondary | ICD-10-CM

## 2022-12-19 DIAGNOSIS — Z79899 Other long term (current) drug therapy: Secondary | ICD-10-CM

## 2022-12-19 DIAGNOSIS — Z7189 Other specified counseling: Secondary | ICD-10-CM

## 2022-12-19 NOTE — Patient Instructions (Addendum)
Cryotherapy Aftercare  Wash gently with soap and water everyday.   Apply Vaseline and Band-Aid daily until healed.   Remove band-aid with squaric acid in 24 hours, left inner upper arm. Use OTC HC cream as needed for inflammation.   Cantharidin Plus is a blistering agent that comes from a beetle.  It needs to be washed off in about 6 hours after application.  Although it is painless when applied in office, it may cause symptoms of mild pain and burning several hours later.  Treated areas will swell and turn red, and blisters may form.  Vaseline and a bandaid may be applied until wound has healed.  Once healed, the skin may remain temporarily discolored.  It can take weeks to months for pigmentation to return to normal.  Advised to wash off with soap and water in 6 hours or sooner if it becomes tender before then.     Start prescription 5-fluorouracil/salicylic acid wart paste from [facility] nightly under occlusion. Reviewed risk of irritation and risk scarring if applied to normal skin. If irritation develops, stop medication for a few days until area calm, then restart a very small amount just to the wart. This medication cannot be used by pregnant women. Patient advised they will receive an email from the [facility] pharmacy and can purchase the medication online through a link in the email.  Instructions for [facility] Medications  One or more of your medications was sent to the [facility] mail order compounding pharmacy. You will receive an email from them and can purchase the medicine through that link. It will then be mailed to your home at the address you confirmed. If for any reason you do not receive an email from them, please check your spam folder. If you still do not find the email, please let us know. [facility] phone number is [phone].    Counseling on HPV and HPV vaccine HPV = human papilloma virus is a viral infection of the skin.  Viral warts/HPV may  be common and transmitted on any part of the body by direct contact.  HPV can also be sexually transmitted and produced viral warts in the genital area that are often called condyloma.  HPV/viral warts/condyloma can be difficult to treat and require multiple treatments and multiple treatment methods.  HPV infections may not be curable and have potential for persistence or recurrence despite multiple treatments.  There is a risk that especially certain types of HPV in the genital area can cause cervical cancer and other genital cancer.  Anecdotally, it seems to be helpful in some patients to have the HPV vaccine series of injections.  In some patients this seems to trigger the immune system to overcome especially difficult HPV infections that have not responded to multiple treatments and multiple methods of treatments. It may be recommended that patients with HPV infections consider HPV vaccine to control thier condition better.  HPV vaccines are FDA approved for up to age 33 years old and comes under the brand name of Gardasil vaccine.    Due to recent changes in healthcare laws, you may see results of your pathology and/or laboratory studies on MyChart before the doctors have had a chance to review them. We understand that in some cases there may be results that are confusing or concerning to you. Please understand that not all results are received at the same time and often the doctors may need to interpret multiple results in order to provide you with  the best plan of care or course of treatment. Therefore, we ask that you please give [place] 2 business days to thoroughly review all your results before contacting the office for clarification. Should we see a critical lab result, you will be contacted sooner.   If You Need Anything After Your Visit  If you have any questions or concerns for your doctor, please call our main line at [phone] and press option 4 to reach your doctor's medical assistant. If no  one answers, please leave a voicemail as directed and we will return your call as soon as possible. Messages left after 4 pm will be answered the following business day.   You may also send [place] a message via MyChart. We typically respond to MyChart messages within 1-2 business days.  For prescription refills, please ask your pharmacy to contact our office. Our fax number is [phone].  If you have an urgent issue when the clinic is closed that cannot wait until the next business day, you can page your doctor at the number below.    Please note that while we do our best to be available for urgent issues outside of office hours, we are not available 24/7.   If you have an urgent issue and are unable to reach [place], you may choose to seek medical care at your doctor's office, retail clinic, urgent care center, or emergency room.  If you have a medical emergency, please immediately call 911 or go to the emergency department.  Pager Numbers  - Dr. Gwen Pounds: [phone]  - Dr. Neale Burly: [phone]  - Dr. Roseanne Reno: [phone]  In the event of inclement weather, please call our main line at [phone] for an update on the status of any delays or closures.  Dermatology Medication Tips: Please keep the boxes that topical medications come in in order to help keep track of the instructions about where and how to use these. Pharmacies typically print the medication instructions only on the boxes and not directly on the medication tubes.   If your medication is too expensive, please contact our office at [phone] option 4 or send [place] a message through MyChart.   We are unable to tell what your co-pay for medications will be in advance as this is different depending on your insurance coverage. However, we may be able to find a substitute medication at lower cost or fill out paperwork to get insurance to cover a needed medication.   If a prior authorization is required to get your medication  covered by your insurance company, please allow [place] 1-2 business days to complete this process.  Drug prices often vary depending on where the prescription is filled and some pharmacies may offer cheaper prices.  The website www.goodrx.com contains coupons for medications through different pharmacies. The prices here do not account for what the cost may be with help from insurance (it may be cheaper with your insurance), but the website can give you the price if you did not use any insurance.  - You can print the associated coupon and take it with your prescription to the pharmacy.  - You may also stop by our office during regular business hours and pick up a GoodRx coupon card.  - If you need your prescription sent electronically to a different pharmacy, notify our office through [facility] or by phone at [phone] option 4.     Si Usted Necesita Algo Despus de Su Visita  Tambin puede enviarnos un mensaje a travs de  MyChart. Por lo general respondemos a los mensajes de MyChart en el transcurso de 1 a 2 das hbiles.  Para renovar recetas, por favor pida a su farmacia que se ponga en contacto con nuestra oficina. Annie Sable de fax es [place] [phone].  Si tiene un asunto urgente cuando la clnica est cerrada y que no puede esperar hasta el siguiente da hbil, puede llamar/localizar a su doctor(a) al nmero que aparece a continuacin.   Por favor, tenga en cuenta que aunque hacemos todo lo posible para estar disponibles para asuntos urgentes fuera del horario de [place], no estamos disponibles las 24 horas del da, los 7 [address]  [address] de la [place].   Si tiene un problema urgente y no puede comunicarse con nosotros, puede optar por buscar atencin mdica  en el consultorio de su doctor(a), en una clnica privada, en un centro de atencin urgente o en una sala de emergencias.  Si tiene Engineer, drilling, por favor llame inmediatamente al 911 o vaya a la sala de  emergencias.  Nmeros de bper  - Dr. Gwen Pounds: [phone]  - Dra. Moye: [phone]  - Dra. Roseanne Reno: [phone]  En caso de inclemencias del [place], por favor llame a Lacy Duverney principal al [phone] para una actualizacin sobre el [place] de cualquier retraso o cierre.  Consejos para la medicacin en dermatologa: Por favor, guarde las cajas en las que vienen los medicamentos de uso tpico para ayudarle a seguir las instrucciones sobre dnde y cmo usarlos. Las farmacias generalmente imprimen las instrucciones del medicamento slo en las cajas y no directamente en los tubos del [place].   Si su medicamento es muy caro, por favor, pngase en contacto con Rolm Gala llamando al [phone] y presione la opcin 4 o envenos un mensaje a travs de Clinical cytogeneticist.   No podemos decirle cul ser su copago por los medicamentos por adelantado ya que esto es diferente dependiendo de la cobertura de su seguro. Sin embargo, es posible que podamos encontrar un medicamento sustituto a Audiological scientist un formulario para que el seguro cubra el medicamento que se considera necesario.   Si se requiere una autorizacin previa para que su compaa de seguros [place] su medicamento, por favor permtanos de 1 a 2 das hbiles para completar [address]  Los precios de los medicamentos varan con frecuencia dependiendo del Environmental consultant de dnde se surte la receta y alguna farmacias pueden ofrecer precios ms baratos.  El sitio web www.goodrx.com tiene cupones para medicamentos de Health and safety inspector. Los precios aqu no tienen en cuenta lo que podra costar con la ayuda del seguro (puede ser ms barato con su seguro), pero el sitio web puede darle el precio si no utiliz Tourist information centre manager.  - Puede imprimir el cupn correspondiente y llevarlo con su receta a la farmacia.  - Tambin puede pasar por nuestra oficina durante el horario de atencin regular y Education officer, museum una tarjeta de cupones de GoodRx.  - Si  necesita que su receta se enve electrnicamente a una farmacia diferente, informe a nuestra oficina a travs de MyChart de [place] o por telfono llamando al [phone] y presione la opcin 4.

## 2022-12-19 NOTE — Progress Notes (Signed)
Follow-Up Visit   Subjective  Keith Cummings is a 42 y.o. male who presents for the following: Wart. Left foot. Has been treated here in the past. Has grown back. Painful to stand on. Has tried to shave off with clean razor blade, grows back.  The patient has spots, moles and lesions to be evaluated, some may be new or changing and the patient may have concern these could be cancer.    The following portions of the chart were reviewed this encounter and updated as appropriate: medications, allergies, medical history  Review of Systems:  No other skin or systemic complaints except as noted in HPI or Assessment and Plan.  Objective  Well appearing patient in no apparent distress; mood and affect are within normal limits.  A focused examination was performed of the following areas: Left plantar foot  Relevant physical exam findings are noted in the Assessment and Plan.  left anterior lateral sole Verrucous plaque 0.8 cm    Assessment & Plan   Plantar wart left anterior lateral sole  Counseling on HPV and HPV vaccine HPV = human papilloma virus is a viral infection of the skin.  Viral warts/HPV may be common and transmitted on any part of the body by direct contact.  HPV can also be sexually transmitted and produced viral warts in the genital area that are often called condyloma.  HPV/viral warts/condyloma can be difficult to treat and require multiple treatments and multiple treatment methods.  HPV infections may not be curable and have potential for persistence or recurrence despite multiple treatments.  There is a risk that especially certain types of HPV in the genital area can cause cervical cancer and other genital cancer.  Anecdotally, it seems to be helpful in some patients to have the HPV vaccine series of injections.  In some patients this seems to trigger the immune system to overcome especially difficult HPV infections that have not responded to multiple treatments and  multiple methods of treatments. It may be recommended that patients with HPV infections consider HPV vaccine to control thier condition better.  HPV vaccines are FDA approved for up to age 71 years old and comes under the brand name of Gardasil vaccine.  Start prescription 5-fluorouracil/salicylic acid wart paste from [facility] nightly under occlusion. Reviewed risk of irritation and risk scarring if applied to normal skin. If irritation develops, stop medication for a few days until area calm, then restart a very small amount just to the wart. This medication cannot be used by pregnant women. Patient advised they will receive an email from the [facility] pharmacy and can purchase the medication online through a link in the email.   Squaric Acid 3% applied to left upper inner arm and covered with band aid. Patient has topical steroid available to treat topically if rash occurs. Remove in 24 hours.   Consider adding candida antigen injections at next visit.   Destruction of lesion - left anterior lateral sole Complexity: simple   Destruction method: cryotherapy   Informed consent: discussed and consent obtained   Timeout:  patient name, date of birth, surgical site, and procedure verified Lesion destroyed using liquid nitrogen: Yes   Region frozen until ice ball extended beyond lesion: Yes   Outcome: patient tolerated procedure well with no complications   Post-procedure details: wound care instructions given   Additional details:  Prior to procedure, discussed risks of blister formation, small wound, skin dyspigmentation, or rare scar following cryotherapy. Recommend Vaseline ointment to  treated areas while healing.   Destruction of lesion - left anterior lateral sole  Destruction method: chemical removal   Informed consent: discussed and consent obtained   Timeout:  patient name, date of birth, surgical site, and procedure verified Chemical destruction method comment:   Cantharidin plus Procedure instructions: patient instructed to wash and dry area   Outcome: patient tolerated procedure well with no complications   Post-procedure details: wound care instructions given   Additional details:  Squaric Acid 3% applied to warts today. Prior to application reviewed risk of inflammation and irritation.      Return for Wart Follow UP in 2-3 months.  I, Lawson Radar, CMA, am acting as scribe for Armida Sans, MD.   Documentation: I have reviewed the above documentation for accuracy and completeness, and I agree with the above.  Armida Sans, MD

## 2022-12-21 ENCOUNTER — Encounter: Payer: Self-pay | Admitting: Dermatology

## 2022-12-27 ENCOUNTER — Telehealth: Payer: Self-pay

## 2022-12-27 ENCOUNTER — Other Ambulatory Visit: Payer: Self-pay

## 2022-12-27 MED ORDER — MOMETASONE FUROATE 0.1 % EX CREA: 1.0000 | TOPICAL_CREAM | Freq: Every day | CUTANEOUS | 0 refills | Status: DC | PRN

## 2022-12-27 NOTE — Telephone Encounter (Signed)
Mometasone sent in to [facility] on [address] per Dr. Zannie Kehr instructions. Patient made aware.

## 2022-12-27 NOTE — Telephone Encounter (Signed)
This is a Dr Gwen Pounds patient that walked in this morning due to a spreading rash on his chest that was transferred from a place that was treated with Squaric Acid on his left upper arm. In his office visit note from 12/19/22 he was to apply a topical steroid if rash did occur but nothing was sent in. Patient said that he does not have anything like that at home to use. Please advise what I should send in for patient to use?

## 2022-12-27 NOTE — Addendum Note (Signed)
Addended by: Evorn Gong B on: 12/27/2022 09:37 AM   Modules accepted: Orders

## 2023-01-01 ENCOUNTER — Encounter: Payer: Self-pay | Admitting: Dermatology

## 2023-01-01 ENCOUNTER — Ambulatory Visit (INDEPENDENT_AMBULATORY_CARE_PROVIDER_SITE_OTHER): Payer: 59 | Admitting: Dermatology

## 2023-01-01 VITALS — BP 126/74

## 2023-01-01 DIAGNOSIS — L235 Allergic contact dermatitis due to other chemical products: Secondary | ICD-10-CM

## 2023-01-01 DIAGNOSIS — Z7189 Other specified counseling: Secondary | ICD-10-CM

## 2023-01-01 DIAGNOSIS — Z79899 Other long term (current) drug therapy: Secondary | ICD-10-CM | POA: Diagnosis not present

## 2023-01-01 MED ORDER — CLOBETASOL PROPIONATE 0.05 % EX CREA: 1.0000 | TOPICAL_CREAM | Freq: Two times a day (BID) | CUTANEOUS | 0 refills | Status: DC

## 2023-01-01 NOTE — Progress Notes (Signed)
   Follow-Up Visit   Subjective  Keith Cummings is a 42 y.o. male who presents for the following: pt had reaction to Squaric Acid sensitization on L upper arm, chest, mometasone cr bid  The following portions of the chart were reviewed this encounter and updated as appropriate: medications, allergies, medical history  Review of Systems:  No other skin or systemic complaints except as noted in HPI or Assessment and Plan.  Objective  Well appearing patient in no apparent distress; mood and affect are within normal limits. A focused examination was performed of the following areas: L upper arm, chest Relevant exam findings are noted in the Assessment and Plan.   Assessment & Plan   Allergic CONTACT DERMATITIS Secondary to squaric acid 3% R dorsum foot, L upper inner arm, L chest, L cheek Exam: crusting at squaric acid site Lupper inner arm, erythema L pectoral, L arm, R dorsum foot, L cheek  Treatment Plan: Cont Mometasone bid to aa face until clear Start Clobetasol cr bid to aa L arm, L chest, R foot for up to 3 weeks, avoid face, groin, axilla  Topical steroids (such as triamcinolone, fluocinolone, fluocinonide, mometasone, clobetasol, halobetasol, betamethasone, hydrocortisone) can cause thinning and lightening of the skin if they are used for too long in the same area. Your physician has selected the right strength medicine for your problem and area affected on the body. Please use your medication only as directed by your physician to prevent side effects.    Return for as scheduled for f/u.  I, Ardis Rowan, RMA, am acting as scribe for Armida Sans, MD .  Documentation: I have reviewed the above documentation for accuracy and completeness, and I agree with the above.  Armida Sans, MD

## 2023-01-01 NOTE — Patient Instructions (Addendum)
Continue Mometasone twice a day to affected area face until clear Start Clobetasol cream twice a day to affected area or rash on Left arm, Left chest, Right foot for up to 3 weeks, avoid face, groin, axilla     Due to recent changes in healthcare laws, you may see results of your pathology and/or laboratory studies on MyChart before the doctors have had a chance to review them. We understand that in some cases there may be results that are confusing or concerning to you. Please understand that not all results are received at the same time and often the doctors may need to interpret multiple results in order to provide you with the best plan of care or course of treatment. Therefore, we ask that you please give [place] 2 business days to thoroughly review all your results before contacting the office for clarification. Should we see a critical lab result, you will be contacted sooner.   If You Need Anything After Your Visit  If you have any questions or concerns for your doctor, please call our main line at [phone] and press option 4 to reach your doctor's medical assistant. If no one answers, please leave a voicemail as directed and we will return your call as soon as possible. Messages left after 4 pm will be answered the following business day.   You may also send [place] a message via MyChart. We typically respond to MyChart messages within 1-2 business days.  For prescription refills, please ask your pharmacy to contact our office. Our fax number is [phone].  If you have an urgent issue when the clinic is closed that cannot wait until the next business day, you can page your doctor at the number below.    Please note that while we do our best to be available for urgent issues outside of office hours, we are not available 24/7.   If you have an urgent issue and are unable to reach [place], you may choose to seek medical care at your doctor's office, retail clinic, urgent care center, or emergency  room.  If you have a medical emergency, please immediately call 911 or go to the emergency department.  Pager Numbers  - Dr. Gwen Pounds: [phone]  - Dr. Neale Burly: [phone]  - Dr. Roseanne Reno: [phone]  In the event of inclement weather, please call our main line at [phone] for an update on the status of any delays or closures.  Dermatology Medication Tips: Please keep the boxes that topical medications come in in order to help keep track of the instructions about where and how to use these. Pharmacies typically print the medication instructions only on the boxes and not directly on the medication tubes.   If your medication is too expensive, please contact our office at [phone] option 4 or send [place] a message through MyChart.   We are unable to tell what your co-pay for medications will be in advance as this is different depending on your insurance coverage. However, we may be able to find a substitute medication at lower cost or fill out paperwork to get insurance to cover a needed medication.   If a prior authorization is required to get your medication covered by your insurance company, please allow [place] 1-2 business days to complete this process.  Drug prices often vary depending on where the prescription is filled and some pharmacies may offer cheaper prices.  The website www.goodrx.com contains coupons for medications through different pharmacies. The prices here do not account for what  the cost may be with help from insurance (it may be cheaper with your insurance), but the website can give you the price if you did not use any insurance.  - You can print the associated coupon and take it with your prescription to the pharmacy.  - You may also stop by our office during regular business hours and pick up a GoodRx coupon card.  - If you need your prescription sent electronically to a different pharmacy, notify our office through [facility] or by phone at [phone]  option 4.     Si Usted Necesita Algo Despus de Su Visita  Tambin puede enviarnos un mensaje a travs de Clinical cytogeneticist. Por lo general respondemos a los mensajes de MyChart en el transcurso de 1 a 2 das hbiles.  Para renovar recetas, por favor pida a su farmacia que se ponga en contacto con nuestra oficina. Annie Sable de fax es [place] [phone].  Si tiene un asunto urgente cuando la clnica est cerrada y que no puede esperar hasta el siguiente da hbil, puede llamar/localizar a su doctor(a) al nmero que aparece a continuacin.   Por favor, tenga en cuenta que aunque hacemos todo lo posible para estar disponibles para asuntos urgentes fuera del horario de [place], no estamos disponibles las 24 horas del da, los 7 [address]  [address] de la [place].   Si tiene un problema urgente y no puede comunicarse con nosotros, puede optar por buscar atencin mdica  en el consultorio de su doctor(a), en una clnica privada, en un centro de atencin urgente o en una sala de emergencias.  Si tiene Engineer, drilling, por favor llame inmediatamente al 911 o vaya a la sala de emergencias.  Nmeros de bper  - Dr. Gwen Pounds: [phone]  - Dra. Moye: [phone]  - Dra. Roseanne Reno: [phone]  En caso de inclemencias del [place], por favor llame a Lacy Duverney principal al [phone] para una actualizacin sobre el [place] de cualquier retraso o cierre.  Consejos para la medicacin en dermatologa: Por favor, guarde las cajas en las que vienen los medicamentos de uso tpico para ayudarle a seguir las instrucciones sobre dnde y cmo usarlos. Las farmacias generalmente imprimen las instrucciones del medicamento slo en las cajas y no directamente en los tubos del [place].   Si su medicamento es muy caro, por favor, pngase en contacto con Rolm Gala llamando al [phone] y presione la opcin 4 o envenos un mensaje a travs de Clinical cytogeneticist.   No podemos decirle cul ser su copago por los medicamentos  por adelantado ya que esto es diferente dependiendo de la cobertura de su seguro. Sin embargo, es posible que podamos encontrar un medicamento sustituto a Audiological scientist un formulario para que el seguro cubra el medicamento que se considera necesario.   Si se requiere una autorizacin previa para que su compaa de seguros [place] su medicamento, por favor permtanos de 1 a 2 das hbiles para completar [address]  Los precios de los medicamentos varan con frecuencia dependiendo del Environmental consultant de dnde se surte la receta y alguna farmacias pueden ofrecer precios ms baratos.  El sitio web www.goodrx.com tiene cupones para medicamentos de Health and safety inspector. Los precios aqu no tienen en cuenta lo que podra costar con la ayuda del seguro (puede ser ms barato con su seguro), pero el sitio web puede darle el precio si no utiliz Tourist information centre manager.  - Puede imprimir el cupn correspondiente y llevarlo con su receta a la farmacia.  - Tambin puede pasar  por nuestra oficina durante el horario de atencin regular y Education officer, museum una tarjeta de cupones de GoodRx.  - Si necesita que su receta se enve electrnicamente a una farmacia diferente, informe a nuestra oficina a travs de MyChart de [place] o por telfono llamando al [phone] y presione la opcin 4.

## 2023-01-14 ENCOUNTER — Other Ambulatory Visit: Payer: Self-pay | Admitting: Dermatology

## 2023-01-15 ENCOUNTER — Telehealth: Payer: Self-pay

## 2023-01-15 ENCOUNTER — Other Ambulatory Visit: Payer: Self-pay

## 2023-01-15 MED ORDER — CLOBETASOL PROPIONATE 0.05 % EX CREA: 1.0000 | TOPICAL_CREAM | Freq: Two times a day (BID) | CUTANEOUS | 0 refills | Status: DC

## 2023-01-15 NOTE — Telephone Encounter (Signed)
Patient called regarding Clobetasol. He is almost out because he is applying to such large surface area. OK to refill ?

## 2023-01-15 NOTE — Telephone Encounter (Signed)
Patient advised of information per Dr. Gwen Pounds. He states the area is improving.

## 2023-01-28 ENCOUNTER — Telehealth: Payer: Self-pay

## 2023-01-28 NOTE — Telephone Encounter (Signed)
Patient called about 5FU cream to wart prescribed at 12/19/22 visit. Patient started using immediately and has noticed the area is very [place]. Patient questioned how long is he suppose to apply cream? Should he be peeling build up off? Patient states the area around wart and the wart itself has turned a very [place] color.

## 2023-01-28 NOTE — Telephone Encounter (Signed)
Left voicemail for patient to return my call. aw

## 2023-02-04 ENCOUNTER — Other Ambulatory Visit: Payer: Self-pay | Admitting: Medical Genetics

## 2023-02-04 DIAGNOSIS — Z006 Encounter for examination for normal comparison and control in clinical research program: Secondary | ICD-10-CM

## 2023-02-06 ENCOUNTER — Other Ambulatory Visit
Admission: RE | Admit: 2023-02-06 | Discharge: 2023-02-06 | Disposition: A | Discharge status: Home / Self Care | Payer: 59 | Source: Ambulatory Visit | Attending: Medical Genetics | Admitting: Medical Genetics

## 2023-02-06 DIAGNOSIS — Z006 Encounter for examination for normal comparison and control in clinical research program: Secondary | ICD-10-CM | POA: Insufficient documentation

## 2023-02-12 ENCOUNTER — Encounter: Payer: Self-pay | Admitting: Family Medicine

## 2023-02-12 ENCOUNTER — Ambulatory Visit (INDEPENDENT_AMBULATORY_CARE_PROVIDER_SITE_OTHER): Payer: No Typology Code available for payment source | Admitting: Family Medicine

## 2023-02-12 VITALS — BP 126/80 | HR 65 | Temp 97.4°F | Ht 70.0 in | Wt 187.8 lb

## 2023-02-12 DIAGNOSIS — B356 Tinea cruris: Secondary | ICD-10-CM | POA: Insufficient documentation

## 2023-02-12 MED ORDER — KETOCONAZOLE 2 % EX CREA
1.0000 | TOPICAL_CREAM | Freq: Every day | CUTANEOUS | 0 refills | Status: DC
Start: 2023-02-12 — End: 2023-02-28

## 2023-02-12 NOTE — Progress Notes (Signed)
  Marikay Alar, MD Phone: [phone]  Keith Cummings is a 42 y.o. male who presents today for same day visit.   Scrotal itching: Patient notes ongoing for the last week.  He notes his scrotum itches and the shaft of his penis and tip of his penis at times itch as well.  No known rash.  He had not had intercourse with his wife for 4 to 6 weeks for some other dermatologic issue and notes around the time they had intercourse this started.  He notes no concern for STIs.  No dysuria.  No penile discharge.  No new soaps or detergents.  He did try topical lotion and Benadryl.  Social History   Tobacco Use  Smoking Status Never  Smokeless Tobacco Never    Current Outpatient Medications on File Prior to Visit  Medication Sig Dispense Refill   clobetasol cream (TEMOVATE) 0.05 % Apply 1 Application topically 2 (two) times daily. Bid to aa rash on left arm, left chest, right foot for up to 3 weeks, avoid face, groin, axilla 30 g 0   Magnesium 250 MG TABS      mometasone (ELOCON) 0.1 % cream Apply 1 Application topically daily as needed (Rash). Apply twice daily as needed to affected areas with rash. 45 g 0   Zinc 30 MG TABS      No current facility-administered medications on file prior to visit.     ROS see history of present illness  Objective  Physical Exam Vitals:   02/12/23 1011  BP: 126/80  Pulse: 65  Temp: (!) 97.4 F (36.3 C)  SpO2: 99%    BP Readings from Last 3 Encounters:  02/12/23 126/80  01/01/23 126/74  12/19/22 120/72   Wt Readings from Last 3 Encounters:  02/12/23 187 lb 12.8 oz (85.2 kg)  07/10/22 188 lb (85.3 kg)  07/10/21 186 lb 3.2 oz (84.5 kg)    Physical Exam Genitourinary:    Comments: Normal penis, normal scrotum, no penile discharge noted, no rash in the genital area noted     Assessment/Plan: Please see individual problem list.  Jock itch Assessment & Plan: Rash is possibly related to jock itch.  Will trial ketoconazole topically for  the next week.  If not improving he will let us know.  If he has any worsening symptoms he will let us know sooner.  Orders: -     Ketoconazole; Apply 1 Application topically daily.  Dispense: 15 g; Refill: 0    Return if symptoms worsen or fail to improve.   Marikay Alar, MD [facility]

## 2023-02-12 NOTE — Assessment & Plan Note (Signed)
Rash is possibly related to jock itch.  Will trial ketoconazole topically for the next week.  If not improving he will let us know.  If he has any worsening symptoms he will let us know sooner.

## 2023-02-19 LAB — GENECONNECT MOLECULAR SCREEN: Genetic Analysis Overall Interpretation: NEGATIVE

## 2023-02-22 ENCOUNTER — Ambulatory Visit: Payer: No Typology Code available for payment source | Admitting: Family Medicine

## 2023-02-28 ENCOUNTER — Ambulatory Visit (INDEPENDENT_AMBULATORY_CARE_PROVIDER_SITE_OTHER): Payer: 59 | Admitting: Dermatology

## 2023-02-28 ENCOUNTER — Encounter: Payer: Self-pay | Admitting: Dermatology

## 2023-02-28 VITALS — BP 125/68 | HR 76

## 2023-02-28 DIAGNOSIS — Z7189 Other specified counseling: Secondary | ICD-10-CM

## 2023-02-28 DIAGNOSIS — B070 Plantar wart: Secondary | ICD-10-CM

## 2023-02-28 DIAGNOSIS — L233 Allergic contact dermatitis due to drugs in contact with skin: Secondary | ICD-10-CM

## 2023-02-28 NOTE — Progress Notes (Signed)
Follow-Up Visit   Subjective  Keith Cummings is a 42 y.o. male who presents for the following: Wart Patient here for plantar wart at left foot.    Hx of plantar wart at left foot, has been using wart paste to area but still some wart left.   The following portions of the chart were reviewed this encounter and updated as appropriate: medications, allergies, medical history  Review of Systems:  No other skin or systemic complaints except as noted in HPI or Assessment and Plan.  Objective  Well appearing patient in no apparent distress; mood and affect are within normal limits.  Areas Examined: Left foot  Relevant physical exam findings are noted in the Assessment and Plan.  Left Medial Plantar Surface Verrucous papules -- Discussed viral etiology and contagion.     Assessment & Plan   Plantar wart Left Medial Plantar Surface  Viral Wart (HPV) Counseling  Discussed viral / HPV (Human Papilloma Virus) etiology and risk of spread /infectivity to other areas of body as well as to other people.  Multiple treatments and methods may be required to clear warts and it is possible treatment may not be successful.  Treatment risks include discoloration; scarring and there is still potential for wart recurrence.  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   Hold wart paste today ok to restart in 2 weeks.   Cantharidin Plus is a blistering agent that comes from a beetle.  It needs to be washed off in about 4 hours after application.  Although it is painless when applied in office, it may cause symptoms of mild pain and burning several hours later.  Treated areas will swell and turn red, and blisters may form.  Vaseline and a bandaid may be applied until wound has healed.  Once healed, the skin may remain temporarily discolored.  It can take weeks to months for pigmentation to return to normal.  Advised to wash  off with soap and water in 4 hours or sooner if it becomes tender before then.    Destruction of lesion - Left Medial Plantar Surface Complexity: simple   Destruction method: cryotherapy   Informed consent: discussed and consent obtained   Timeout:  patient name, date of birth, surgical site, and procedure verified Lesion destroyed using liquid nitrogen: Yes   Region frozen until ice ball extended beyond lesion: Yes   Outcome: patient tolerated procedure well with no complications   Post-procedure details: wound care instructions given    Destruction of lesion - Left Medial Plantar Surface  Destruction method: chemical removal   Informed consent: discussed and consent obtained   Timeout:  patient name, date of birth, surgical site, and procedure verified Chemical destruction method: cantharidin   Chemical destruction method comment:  Plus Application time:  4 hours Procedure instructions: patient instructed to wash and dry area   Outcome: patient tolerated procedure well with no complications   Post-procedure details: wound care instructions given   Additional details:  Cantharidin Plus is a blistering agent that comes from a beetle.  It needs to be washed off in about 4-6 hours after application.  Although it is painless when applied in office, it may cause symptoms of mild pain and burning several hours later.  Treated areas will swell and turn red, and blisters may form.  Vaseline and a bandaid may be applied until wound has healed.  Once healed,  the skin may remain temporarily discolored.  It can take weeks to months for pigmentation to return to normal.  Advised to wash off with soap and water in 4-6 hours or sooner if it becomes tender before then.    Return for january wart follow up.  IAsher Muir, CMA, am acting as scribe for Armida Sans, MD.   Documentation: I have reviewed the above documentation for accuracy and completeness, and I agree with the above.  Armida Sans, MD

## 2023-02-28 NOTE — Patient Instructions (Addendum)
Viral Wart (HPV) Counseling  Discussed viral / HPV (Human Papilloma Virus) etiology and risk of spread /infectivity to other areas of body as well as to other people.  Multiple treatments and methods may be required to clear warts and it is possible treatment may not be successful.  Treatment risks include discoloration; scarring and there is still potential for wart recurrence.  Cantharidin Plus is a blistering agent that comes from a beetle.  It needs to be washed off in about 4 hours after application.  Although it is painless when applied in office, it may cause symptoms of mild pain and burning several hours later.  Treated areas will swell and turn red, and blisters may form.  Vaseline and a bandaid may be applied until wound has healed.  Once healed, the skin may remain temporarily discolored.  It can take weeks to months for pigmentation to return to normal.  Advised to wash off with soap and water in 4 hours or sooner if it becomes tender before then.  Prior to procedure, discussed risks of blister formation, small wound, skin dyspigmentation, or rare scar following cryotherapy. Recommend Vaseline ointment to treated areas while healing.  Hold wart paste for 2 weeks then restart wart paste as directed before. Stop after 2 months of treatment but continue tape    Due to recent changes in healthcare laws, you may see results of your pathology and/or laboratory studies on MyChart before the doctors have had a chance to review them. We understand that in some cases there may be results that are confusing or concerning to you. Please understand that not all results are received at the same time and often the doctors may need to interpret multiple results in order to provide you with the best plan of care or course of treatment. Therefore, we ask that you please give [place] 2 business days to thoroughly review all your results before contacting the office for clarification. Should we see a critical lab  result, you will be contacted sooner.   If You Need Anything After Your Visit  If you have any questions or concerns for your doctor, please call our main line at [phone] and press option 4 to reach your doctor's medical assistant. If no one answers, please leave a voicemail as directed and we will return your call as soon as possible. Messages left after 4 pm will be answered the following business day.   You may also send [place] a message via MyChart. We typically respond to MyChart messages within 1-2 business days.  For prescription refills, please ask your pharmacy to contact our office. Our fax number is [phone].  If you have an urgent issue when the clinic is closed that cannot wait until the next business day, you can page your doctor at the number below.    Please note that while we do our best to be available for urgent issues outside of office hours, we are not available 24/7.   If you have an urgent issue and are unable to reach [place], you may choose to seek medical care at your doctor's office, retail clinic, urgent care center, or emergency room.  If you have a medical emergency, please immediately call 911 or go to the emergency department.  Pager Numbers  - Dr. Gwen Pounds: [phone]  - Dr. Roseanne Reno: [phone]  - Dr. Katrinka Blazing: [phone]   In the event of inclement weather, please call our main line at [phone] for an update on the status of any  delays or closures.  Dermatology Medication Tips: Please keep the boxes that topical medications come in in order to help keep track of the instructions about where and how to use these. Pharmacies typically print the medication instructions only on the boxes and not directly on the medication tubes.   If your medication is too expensive, please contact our office at [phone] option 4 or send [place] a message through MyChart.   We are unable to tell what your co-pay for medications will be in advance as this is  different depending on your insurance coverage. However, we may be able to find a substitute medication at lower cost or fill out paperwork to get insurance to cover a needed medication.   If a prior authorization is required to get your medication covered by your insurance company, please allow [place] 1-2 business days to complete this process.  Drug prices often vary depending on where the prescription is filled and some pharmacies may offer cheaper prices.  The website www.goodrx.com contains coupons for medications through different pharmacies. The prices here do not account for what the cost may be with help from insurance (it may be cheaper with your insurance), but the website can give you the price if you did not use any insurance.  - You can print the associated coupon and take it with your prescription to the pharmacy.  - You may also stop by our office during regular business hours and pick up a GoodRx coupon card.  - If you need your prescription sent electronically to a different pharmacy, notify our office through [facility] or by phone at [phone] option 4.     Si Usted Necesita Algo Despus de Su Visita  Tambin puede enviarnos un mensaje a travs de Clinical cytogeneticist. Por lo general respondemos a los mensajes de MyChart en el transcurso de 1 a 2 das hbiles.  Para renovar recetas, por favor pida a su farmacia que se ponga en contacto con nuestra oficina. Annie Sable de fax es [place] [phone].  Si tiene un asunto urgente cuando la clnica est cerrada y que no puede esperar hasta el siguiente da hbil, puede llamar/localizar a su doctor(a) al nmero que aparece a continuacin.   Por favor, tenga en cuenta que aunque hacemos todo lo posible para estar disponibles para asuntos urgentes fuera del horario de [place], no estamos disponibles las 24 horas del da, los 7 [address]  [address] de la [place].   Si tiene un problema urgente y no puede comunicarse con nosotros, puede optar por buscar  atencin mdica  en el consultorio de su doctor(a), en una clnica privada, en un centro de atencin urgente o en una sala de emergencias.  Si tiene Engineer, drilling, por favor llame inmediatamente al 911 o vaya a la sala de emergencias.  Nmeros de bper  - Dr. Gwen Pounds: [phone]  - Dra. Roseanne Reno: [phone]  - Dr. Katrinka Blazing: [phone]   En caso de inclemencias del tiempo, por favor llame a Lacy Duverney principal al [phone] para una actualizacin sobre el [place] de cualquier retraso o cierre.  Consejos para la medicacin en dermatologa: Por favor, guarde las cajas en las que vienen los medicamentos de uso tpico para ayudarle a seguir las instrucciones sobre dnde y cmo usarlos. Las farmacias generalmente imprimen las instrucciones del medicamento slo en las cajas y no directamente en los tubos del [place].   Si su medicamento es muy caro, por favor, pngase en contacto con Rolm Gala llamando al [phone] y presione la  opcin 4 o envenos un mensaje a travs de MyChart.   No podemos decirle cul ser su copago por los medicamentos por adelantado ya que esto es diferente dependiendo de la cobertura de su seguro. Sin embargo, es posible que podamos encontrar un medicamento sustituto a Audiological scientist un formulario para que el seguro cubra el medicamento que se considera necesario.   Si se requiere una autorizacin previa para que su compaa de seguros [place] su medicamento, por favor permtanos de 1 a 2 das hbiles para completar [address]  Los precios de los medicamentos varan con frecuencia dependiendo del Environmental consultant de dnde se surte la receta y alguna farmacias pueden ofrecer precios ms baratos.  El sitio web www.goodrx.com tiene cupones para medicamentos de Health and safety inspector. Los precios aqu no tienen en cuenta lo que podra costar con la ayuda del seguro (puede ser ms barato con su seguro), pero el sitio web puede darle el precio si no utiliz  Tourist information centre manager.  - Puede imprimir el cupn correspondiente y llevarlo con su receta a la farmacia.  - Tambin puede pasar por nuestra oficina durante el horario de atencin regular y Education officer, museum una tarjeta de cupones de GoodRx.  - Si necesita que su receta se enve electrnicamente a una farmacia diferente, informe a nuestra oficina a travs de MyChart de [place] o por telfono llamando al [phone] y presione la opcin 4.

## 2023-03-05 ENCOUNTER — Encounter: Payer: Self-pay | Admitting: Dermatology

## 2023-03-27 ENCOUNTER — Ambulatory Visit: Payer: BC Managed Care – PPO | Admitting: Dermatology

## 2023-06-20 ENCOUNTER — Ambulatory Visit: Payer: 59 | Admitting: Dermatology

## 2023-07-04 ENCOUNTER — Ambulatory Visit: Payer: 59 | Admitting: Dermatology

## 2023-07-04 DIAGNOSIS — L821 Other seborrheic keratosis: Secondary | ICD-10-CM

## 2023-07-04 DIAGNOSIS — L814 Other melanin hyperpigmentation: Secondary | ICD-10-CM | POA: Diagnosis not present

## 2023-07-04 DIAGNOSIS — D2271 Melanocytic nevi of right lower limb, including hip: Secondary | ICD-10-CM

## 2023-07-04 DIAGNOSIS — L700 Acne vulgaris: Secondary | ICD-10-CM

## 2023-07-04 DIAGNOSIS — D1801 Hemangioma of skin and subcutaneous tissue: Secondary | ICD-10-CM

## 2023-07-04 DIAGNOSIS — D229 Melanocytic nevi, unspecified: Secondary | ICD-10-CM

## 2023-07-04 DIAGNOSIS — Z1283 Encounter for screening for malignant neoplasm of skin: Secondary | ICD-10-CM | POA: Diagnosis not present

## 2023-07-04 DIAGNOSIS — D225 Melanocytic nevi of trunk: Secondary | ICD-10-CM

## 2023-07-04 DIAGNOSIS — D2272 Melanocytic nevi of left lower limb, including hip: Secondary | ICD-10-CM

## 2023-07-04 DIAGNOSIS — Z86018 Personal history of other benign neoplasm: Secondary | ICD-10-CM

## 2023-07-04 NOTE — Patient Instructions (Addendum)
Melanoma ABCDEs  Melanoma is the most dangerous type of skin cancer, and is the leading cause of death from skin disease.  You are more likely to develop melanoma if you: Have light-colored skin, light-colored eyes, or red or blond hair Spend a lot of time in the sun Tan regularly, either outdoors or in a tanning bed Have had blistering sunburns, especially during childhood Have a close family member who has had a melanoma Have atypical moles or large birthmarks  Early detection of melanoma is key since treatment is typically straightforward and cure rates are extremely high if we catch it early.   The first sign of melanoma is often a change in a mole or a new dark spot.  The ABCDE system is a way of remembering the signs of melanoma.  A for asymmetry:  The two halves do not match. B for border:  The edges of the growth are irregular. C for color:  A mixture of colors are present instead of an even brown color. D for diameter:  Melanomas are usually (but not always) greater than 6mm - the size of a pencil eraser. E for evolution:  The spot keeps changing in size, shape, and color.  Please check your skin once per month between visits. You can use a small mirror in front and a large mirror behind you to keep an eye on the back side or your body.   If you see any new or changing lesions before your next follow-up, please call to schedule a visit.  Please continue daily skin protection including broad spectrum sunscreen SPF 30+ to sun-exposed areas, reapplying every 2 hours as needed when you're outdoors.        Recommend daily broad spectrum sunscreen SPF 30+ to sun-exposed areas, reapply every 2 hours as needed. Call for new or changing lesions.  Staying in the shade or wearing long sleeves, sun glasses (UVA+UVB protection) and wide brim hats (4-inch brim around the entire circumference of the hat) are also recommended for sun protection.      Due to recent changes in healthcare  laws, you may see results of your pathology and/or laboratory studies on MyChart before the doctors have had a chance to review them. We understand that in some cases there may be results that are confusing or concerning to you. Please understand that not all results are received at the same time and often the doctors may need to interpret multiple results in order to provide you with the best plan of care or course of treatment. Therefore, we ask that you please give [place] 2 business days to thoroughly review all your results before contacting the office for clarification. Should we see a critical lab result, you will be contacted sooner.   If You Need Anything After Your Visit  If you have any questions or concerns for your doctor, please call our main line at [phone] and press option 4 to reach your doctor's medical assistant. If no one answers, please leave a voicemail as directed and we will return your call as soon as possible. Messages left after 4 pm will be answered the following business day.   You may also send [place] a message via MyChart. We typically respond to MyChart messages within 1-2 business days.  For prescription refills, please ask your pharmacy to contact our office. Our fax number is [phone].  If you have an urgent issue when the clinic is closed that cannot wait until the next business day, you  can page your doctor at the number below.    Please note that while we do our best to be available for urgent issues outside of office hours, we are not available 24/7.   If you have an urgent issue and are unable to reach [place], you may choose to seek medical care at your doctor's office, retail clinic, urgent care center, or emergency room.  If you have a medical emergency, please immediately call 911 or go to the emergency department.  Pager Numbers  - Dr. Gwen Pounds: [phone]  - Dr. Roseanne Reno: [phone]  - Dr. Katrinka Blazing: [phone]   In the event of inclement weather,  please call our main line at [phone] for an update on the status of any delays or closures.  Dermatology Medication Tips: Please keep the boxes that topical medications come in in order to help keep track of the instructions about where and how to use these. Pharmacies typically print the medication instructions only on the boxes and not directly on the medication tubes.   If your medication is too expensive, please contact our office at [phone] option 4 or send [place] a message through MyChart.   We are unable to tell what your co-pay for medications will be in advance as this is different depending on your insurance coverage. However, we may be able to find a substitute medication at lower cost or fill out paperwork to get insurance to cover a needed medication.   If a prior authorization is required to get your medication covered by your insurance company, please allow [place] 1-2 business days to complete this process.  Drug prices often vary depending on where the prescription is filled and some pharmacies may offer cheaper prices.  The website www.goodrx.com contains coupons for medications through different pharmacies. The prices here do not account for what the cost may be with help from insurance (it may be cheaper with your insurance), but the website can give you the price if you did not use any insurance.  - You can print the associated coupon and take it with your prescription to the pharmacy.  - You may also stop by our office during regular business hours and pick up a GoodRx coupon card.  - If you need your prescription sent electronically to a different pharmacy, notify our office through [facility] or by phone at [phone] option 4.     Si Usted Necesita Algo Despus de Su Visita  Tambin puede enviarnos un mensaje a travs de Clinical cytogeneticist. Por lo general respondemos a los mensajes de MyChart en el transcurso de 1 a 2 das hbiles.  Para renovar recetas, por favor  pida a su farmacia que se ponga en contacto con nuestra oficina. Annie Sable de fax es [place] [phone].  Si tiene un asunto urgente cuando la clnica est cerrada y que no puede esperar hasta el siguiente da hbil, puede llamar/localizar a su doctor(a) al nmero que aparece a continuacin.   Por favor, tenga en cuenta que aunque hacemos todo lo posible para estar disponibles para asuntos urgentes fuera del horario de [place], no estamos disponibles las 24 horas del da, los 7 [address]  [address] de la [place].   Si tiene un problema urgente y no puede comunicarse con nosotros, puede optar por buscar atencin mdica  en el consultorio de su doctor(a), en una clnica privada, en un centro de atencin urgente o en una sala de emergencias.  Si tiene una emergencia mdica, por favor llame inmediatamente al 911 o vaya a  la sala de emergencias.  Nmeros de bper  - Dr. Gwen Pounds: [phone]  - Dra. Roseanne Reno: [phone]  - Dr. Katrinka Blazing: [phone]   En caso de inclemencias del tiempo, por favor llame a Lacy Duverney principal al [phone] para una actualizacin sobre el [place] de cualquier retraso o cierre.  Consejos para la medicacin en dermatologa: Por favor, guarde las cajas en las que vienen los medicamentos de uso tpico para ayudarle a seguir las instrucciones sobre dnde y cmo usarlos. Las farmacias generalmente imprimen las instrucciones del medicamento slo en las cajas y no directamente en los tubos del [place].   Si su medicamento es muy caro, por favor, pngase en contacto con Rolm Gala llamando al [phone] y presione la opcin 4 o envenos un mensaje a travs de Clinical cytogeneticist.   No podemos decirle cul ser su copago por los medicamentos por adelantado ya que esto es diferente dependiendo de la cobertura de su seguro. Sin embargo, es posible que podamos encontrar un medicamento sustituto a Audiological scientist un formulario para que el seguro cubra el medicamento que se considera  necesario.   Si se requiere una autorizacin previa para que su compaa de seguros [place] su medicamento, por favor permtanos de 1 a 2 das hbiles para completar [address]  Los precios de los medicamentos varan con frecuencia dependiendo del Environmental consultant de dnde se surte la receta y alguna farmacias pueden ofrecer precios ms baratos.  El sitio web www.goodrx.com tiene cupones para medicamentos de Health and safety inspector. Los precios aqu no tienen en cuenta lo que podra costar con la ayuda del seguro (puede ser ms barato con su seguro), pero el sitio web puede darle el precio si no utiliz Tourist information centre manager.  - Puede imprimir el cupn correspondiente y llevarlo con su receta a la farmacia.  - Tambin puede pasar por nuestra oficina durante el horario de atencin regular y Education officer, museum una tarjeta de cupones de GoodRx.  - Si necesita que su receta se enve electrnicamente a una farmacia diferente, informe a nuestra oficina a travs de MyChart de [place] o por telfono llamando al [phone] y presione la opcin 4.

## 2023-07-04 NOTE — Progress Notes (Signed)
   Follow-Up Visit   Subjective  Keith Cummings is a 43 y.o. male who presents for the following: Skin Cancer Screening and Full Body Skin Exam  The patient presents for Total-Body Skin Exam (TBSE) for skin cancer screening and mole check. The patient has spots, moles and lesions to be evaluated, some may be new or changing and the patient may have concern these could be cancer.    The following portions of the chart were reviewed this encounter and updated as appropriate: medications, allergies, medical history  Review of Systems:  No other skin or systemic complaints except as noted in HPI or Assessment and Plan.  Objective  Well appearing patient in no apparent distress; mood and affect are within normal limits.  A full examination was performed including scalp, head, eyes, ears, nose, lips, neck, chest, axillae, abdomen, back, buttocks, bilateral upper extremities, bilateral lower extremities, hands, feet, fingers, toes, fingernails, and toenails. All findings within normal limits unless otherwise noted below.   Relevant physical exam findings are noted in the Assessment and Plan.         Assessment & Plan   SKIN CANCER SCREENING PERFORMED TODAY.  LENTIGINES, SEBORRHEIC KERATOSES, HEMANGIOMAS - Benign normal skin lesions - Benign-appearing - Call for any changes  NEVUS - Tan-brown and/or pink-flesh-colored symmetric macules and papules - several regular medium brown macules at the right flank, see photo - 2.5 mm medium dark brown macule at the left medial thigh, see photo - 3 mm medium brown macule on the right plantar foot, see photo - Benign appearing on exam today - Observation - Call clinic for new or changing moles - Recommend daily use of broad spectrum spf 30+ sunscreen to sun-exposed areas.   ACNE VULGARIS Exam: few inflammatory papules on the anterior neck  Treatment Plan: Start samples of Neutrogena stubborn acne BP gel use qd-bid as spot  treatment    HISTORY OF DYSPLASTIC NEVUS Multiple sites see history  No evidence of recurrence today Recommend regular full body skin exams Recommend daily broad spectrum sunscreen SPF 30+ to sun-exposed areas, reapply every 2 hours as needed.  Call if any new or changing lesions are noted between office visits     Return in about 1 year (around 07/03/2024) for TBSE, hx of Dysplastic nevus .  I, Angelique Holm, CMA, am acting as scribe for Willeen Niece, MD .   Documentation: I have reviewed the above documentation for accuracy and completeness, and I agree with the above.  Willeen Niece, MD

## 2023-07-15 ENCOUNTER — Encounter: Payer: 59 | Admitting: Family Medicine

## 2023-07-16 ENCOUNTER — Encounter: Payer: Self-pay | Admitting: Nurse Practitioner

## 2023-07-16 ENCOUNTER — Ambulatory Visit: Payer: 59

## 2023-07-16 ENCOUNTER — Ambulatory Visit (INDEPENDENT_AMBULATORY_CARE_PROVIDER_SITE_OTHER): Payer: 59 | Admitting: Nurse Practitioner

## 2023-07-16 VITALS — BP 110/72 | HR 76 | Temp 97.7°F | Ht 71.0 in | Wt 194.0 lb

## 2023-07-16 DIAGNOSIS — E663 Overweight: Secondary | ICD-10-CM | POA: Diagnosis not present

## 2023-07-16 DIAGNOSIS — G8929 Other chronic pain: Secondary | ICD-10-CM | POA: Diagnosis not present

## 2023-07-16 DIAGNOSIS — M545 Low back pain, unspecified: Secondary | ICD-10-CM | POA: Diagnosis not present

## 2023-07-16 DIAGNOSIS — Z23 Encounter for immunization: Secondary | ICD-10-CM

## 2023-07-16 DIAGNOSIS — R0683 Snoring: Secondary | ICD-10-CM

## 2023-07-16 DIAGNOSIS — Z1329 Encounter for screening for other suspected endocrine disorder: Secondary | ICD-10-CM

## 2023-07-16 DIAGNOSIS — Z0001 Encounter for general adult medical examination with abnormal findings: Secondary | ICD-10-CM

## 2023-07-16 DIAGNOSIS — E781 Pure hyperglyceridemia: Secondary | ICD-10-CM | POA: Diagnosis not present

## 2023-07-16 DIAGNOSIS — Z820 Family history of epilepsy and other diseases of the nervous system: Secondary | ICD-10-CM | POA: Diagnosis not present

## 2023-07-16 LAB — CBC WITH DIFFERENTIAL/PLATELET
Basophils Absolute: 0 10*3/uL (ref 0.0–0.1)
Basophils Relative: 0.6 % (ref 0.0–3.0)
Eosinophils Absolute: 0.3 10*3/uL (ref 0.0–0.7)
Eosinophils Relative: 4.9 % (ref 0.0–5.0)
HCT: 41.7 % (ref 39.0–52.0)
Hemoglobin: 14.2 g/dL (ref 13.0–17.0)
Lymphocytes Relative: 29.4 % (ref 12.0–46.0)
Lymphs Abs: 1.9 10*3/uL (ref 0.7–4.0)
MCHC: 34 g/dL (ref 30.0–36.0)
MCV: 88.5 fL (ref 78.0–100.0)
Monocytes Absolute: 0.7 10*3/uL (ref 0.1–1.0)
Monocytes Relative: 10.4 % (ref 3.0–12.0)
Neutro Abs: 3.6 10*3/uL (ref 1.4–7.7)
Neutrophils Relative %: 54.7 % (ref 43.0–77.0)
Platelets: 258 10*3/uL (ref 150.0–400.0)
RBC: 4.71 Mil/uL (ref 4.22–5.81)
RDW: 12.3 % (ref 11.5–15.5)
WBC: 6.6 10*3/uL (ref 4.0–10.5)

## 2023-07-16 LAB — LIPID PANEL
Cholesterol: 203 mg/dL — ABNORMAL HIGH (ref 0–200)
HDL: 30.8 mg/dL — ABNORMAL LOW (ref >39.00)
LDL Cholesterol: 126 mg/dL — ABNORMAL HIGH (ref 0–99)
NonHDL: 172.54
Total CHOL/HDL Ratio: 7
Triglycerides: 233 mg/dL — ABNORMAL HIGH (ref 0.0–149.0)
VLDL: 46.6 mg/dL — ABNORMAL HIGH (ref 0.0–40.0)

## 2023-07-16 LAB — COMPREHENSIVE METABOLIC PANEL
ALT: 20 U/L (ref 0–53)
AST: 19 U/L (ref 0–37)
Albumin: 4.6 g/dL (ref 3.5–5.2)
Alkaline Phosphatase: 19 U/L — ABNORMAL LOW (ref 39–117)
BUN: 15 mg/dL (ref 6–23)
CO2: 30 meq/L (ref 19–32)
Calcium: 9.5 mg/dL (ref 8.4–10.5)
Chloride: 102 meq/L (ref 96–112)
Creatinine, Ser: 0.99 mg/dL (ref 0.40–1.50)
GFR: 95.34 mL/min (ref >60.00)
Glucose, Bld: 81 mg/dL (ref 70–99)
Potassium: 4.1 meq/L (ref 3.5–5.1)
Sodium: 141 meq/L (ref 135–145)
Total Bilirubin: 0.5 mg/dL (ref 0.2–1.2)
Total Protein: 7.4 g/dL (ref 6.0–8.3)

## 2023-07-16 LAB — TSH: TSH: 1.12 u[IU]/mL (ref 0.35–5.50)

## 2023-07-16 LAB — HEMOGLOBIN A1C: Hgb A1c MFr Bld: 5.4 % (ref 4.6–6.5)

## 2023-07-16 NOTE — Progress Notes (Signed)
 Keith Cummings, Keith Cummings Phone: [phone]  Keith Cummings is a 43 y.o. male who presents today for transfer of care and annual exam.   Discussed the use of AI scribe software for clinical note transcription with the patient, who gave verbal consent to proceed.  History of Present Illness   Keith Cummings is a 43 year old male who presents for an annual exam and transfer of care.  He experiences lower back pain primarily when transitioning from a lying to a sitting position, such as during crunches or rolling up from a lying position. The pain is localized to the lower back without numbness, tingling, or weakness in the legs, and no bowel or bladder dysfunction. The duration of the pain is unspecified, and it seems to be aggravated by his sleep position, as he sleeps at an incline to reduce snoring. He has been working with a systems analyst for the past two months, who modifies exercises accordingly. No recent injuries are reported.  He reports snoring and has previously undergone a sleep study that ruled out sleep apnea. He sleeps at an incline to reduce snoring and is considering the possibility of a deviated septum contributing to his symptoms. He has a history of seasonal allergies.  He mentions occasional tightness or a 'nerve kind of twinge' in his throat area, which resolves quickly. No associated pain or difficulty swallowing is noted.  He describes his skin as oily and follows a skincare routine that includes a face cleanser, exfoliant, and moisturizer. He uses these products daily and is considering incorporating retinol at night.  He has a family history of low thyroid  levels, with his mother and several uncles and aunts affected. He is concerned about this and inquires if it should be checked. He also mentions a family history of Alzheimer's disease, with two grandparents affected, and expresses interest in genetic testing for Alzheimer's risk.  He is working with a  systems analyst twice a week to improve his exercise form and is focusing on increasing his protein intake and consuming more fruits and vegetables. He is trying to reduce his intake of processed foods and sugary drinks, although he finds it challenging to switch from sweet to unsweetened tea. No significant mood issues, anxiety, or depression that require intervention. He reports sleeping about seven hours per night, although he aims for eight hours. He finds it easier to fall asleep around 11:30 PM and is considering starting his bedtime routine earlier.      Social History   Tobacco Use  Smoking Status Never  Smokeless Tobacco Never    Current Outpatient Medications on File Prior to Visit  Medication Sig Dispense Refill   Magnesium 250 MG TABS      Zinc  30 MG TABS      No current facility-administered medications on file prior to visit.    ROS see history of present illness  Objective  Physical Exam Vitals:   07/16/23 1000  BP: 110/72  Pulse: 76  Temp: 97.7 F (36.5 C)  SpO2: 98%    BP Readings from Last 3 Encounters:  07/16/23 110/72  02/28/23 125/68  02/12/23 126/80   Wt Readings from Last 3 Encounters:  07/16/23 194 lb (88 kg)  02/12/23 187 lb 12.8 oz (85.2 kg)  07/10/22 188 lb (85.3 kg)    Physical Exam Constitutional:      General: He is not in acute distress.    Appearance: Normal appearance.  HENT:     Head: Normocephalic.  Right Ear: Tympanic membrane normal.     Left Ear: Tympanic membrane normal.     Nose: Nose normal.     Mouth/Throat:     Mouth: Mucous membranes are moist.     Pharynx: Oropharynx is clear.  Eyes:     Conjunctiva/sclera: Conjunctivae normal.     Pupils: Pupils are equal, round, and reactive to light.  Neck:     Thyroid : No thyromegaly.  Cardiovascular:     Rate and Rhythm: Normal rate and regular rhythm.     Heart sounds: Normal heart sounds.  Pulmonary:     Effort: Pulmonary effort is normal.     Breath sounds:  Normal breath sounds.  Abdominal:     General: Abdomen is flat. Bowel sounds are normal.     Palpations: Abdomen is soft. There is no mass.     Tenderness: There is no abdominal tenderness.  Musculoskeletal:        General: Normal range of motion.     Lumbar back: Normal. No signs of trauma, spasms or tenderness. Normal range of motion.  Lymphadenopathy:     Cervical: No cervical adenopathy.  Skin:    General: Skin is warm and dry.     Findings: No rash.  Neurological:     General: No focal deficit present.     Mental Status: He is alert.  Psychiatric:        Mood and Affect: Mood normal.        Behavior: Behavior normal.    Assessment/Plan: Please see individual problem list.  Encounter for routine adult medical exam with abnormal findings Assessment & Plan: Physical exam complete. We will order routine lab work as outlined. Flu shot administered today. Tetanus vaccine up to date. Declines additional COVID vaccines. Continue routine dental exams, consider annual eye exams. Continue the current diet and exercise regimen. Return to care in one year, sooner as needed.  Orders: -     CBC with Differential/Platelet -     Comprehensive metabolic panel  Chronic bilateral low back pain without sciatica Assessment & Plan: Pain is localized to the lower back and worsens with movements like rising from a lying position. There is no radiation of pain, numbness, tingling, or changes in bowel or bladder function. The pain may be related to sleep position or musculoskeletal issues. Order a lumbar x-ray to rule out structural abnormalities. Consider physical therapy for strengthening exercises and stretches, pending x-ray results.  Orders: -     DG Lumbar Spine 2-3 Views; Future  Snoring Assessment & Plan: Snoring is reduced by sleeping at an incline. There is no diagnosis of sleep apnea, but a deviated septum or allergies may contribute. Previously seen by ENT, he will contact them for  follow up. He will contact if new referral is needed.    Family history of Alzheimer's disease Assessment & Plan: Consider genetic testing for Alzheimer's disease, given family history, at his discretion. He will look into places where he can complete this.    Overweight (BMI 25.0-29.9) -     Hemoglobin A1c  Hypertriglyceridemia -     Lipid panel  Thyroid  disorder screen -     TSH  Need for influenza vaccination -     Flu vaccine trivalent PF, 6mos and older(Flulaval,Afluria,Fluarix,Fluzone)    Return in about 1 year (around 07/15/2024) for Annual Exam, sooner as needed.   Keith Cummings, Keith Cummings  Primary Care - [facility]

## 2023-07-17 ENCOUNTER — Encounter: Payer: Self-pay | Admitting: Nurse Practitioner

## 2023-07-18 ENCOUNTER — Other Ambulatory Visit: Payer: Self-pay | Admitting: Nurse Practitioner

## 2023-07-18 DIAGNOSIS — J302 Other seasonal allergic rhinitis: Secondary | ICD-10-CM

## 2023-07-18 DIAGNOSIS — R0683 Snoring: Secondary | ICD-10-CM

## 2023-07-23 ENCOUNTER — Encounter: Payer: Self-pay | Admitting: Nurse Practitioner

## 2023-07-23 DIAGNOSIS — Z820 Family history of epilepsy and other diseases of the nervous system: Secondary | ICD-10-CM | POA: Insufficient documentation

## 2023-07-23 NOTE — Assessment & Plan Note (Signed)
Consider genetic testing for Alzheimer's disease, given family history, at his discretion. He will look into places where he can complete this.

## 2023-07-23 NOTE — Assessment & Plan Note (Signed)
Physical exam complete. We will order routine lab work as outlined. Flu shot administered today. Tetanus vaccine up to date. Declines additional COVID vaccines. Continue routine dental exams, consider annual eye exams. Continue the current diet and exercise regimen. Return to care in one year, sooner as needed.

## 2023-07-23 NOTE — Assessment & Plan Note (Signed)
Snoring is reduced by sleeping at an incline. There is no diagnosis of sleep apnea, but a deviated septum or allergies may contribute. Previously seen by ENT, he will contact them for follow up. He will contact if new referral is needed.

## 2023-07-23 NOTE — Assessment & Plan Note (Signed)
Pain is localized to the lower back and worsens with movements like rising from a lying position. There is no radiation of pain, numbness, tingling, or changes in bowel or bladder function. The pain may be related to sleep position or musculoskeletal issues. Order a lumbar x-ray to rule out structural abnormalities. Consider physical therapy for strengthening exercises and stretches, pending x-ray results.

## 2023-07-30 ENCOUNTER — Encounter: Payer: Self-pay | Admitting: Nurse Practitioner

## 2024-03-27 ENCOUNTER — Encounter: Payer: Self-pay | Admitting: Nurse Practitioner

## 2024-04-03 ENCOUNTER — Ambulatory Visit (INDEPENDENT_AMBULATORY_CARE_PROVIDER_SITE_OTHER): Admitting: Nurse Practitioner

## 2024-04-03 ENCOUNTER — Encounter: Payer: Self-pay | Admitting: Nurse Practitioner

## 2024-04-03 VITALS — BP 118/76 | HR 75 | Temp 97.6°F | Ht 71.0 in | Wt 184.2 lb

## 2024-04-03 DIAGNOSIS — H6120 Impacted cerumen, unspecified ear: Secondary | ICD-10-CM

## 2024-04-03 DIAGNOSIS — Z23 Encounter for immunization: Secondary | ICD-10-CM | POA: Diagnosis not present

## 2024-04-03 DIAGNOSIS — M79672 Pain in left foot: Secondary | ICD-10-CM

## 2024-04-03 DIAGNOSIS — M26621 Arthralgia of right temporomandibular joint: Secondary | ICD-10-CM

## 2024-04-03 NOTE — Progress Notes (Signed)
 Established Patient Office Visit  Subjective:  Patient ID: Keith Cummings, male    DOB: 1981/06/01  Age: 43 y.o. MRN: [id number]  CC:  Chief Complaint  Patient presents with   Acute Visit    Red ear wax   Discussed the use of a AI scribe software for clinical note transcription with the patient, who gave verbal consent to proceed.  HPI  Keith Cummings is a 43 year old male who presents with red ear wax and jaw pain.  Two weeks ago, he noticed distinctly red ear wax, which felt as though it 'fell down' in his ear, prompting removal. This was a singular occurrence with no similar incidents in recent years. He denies ear pain or hearing difficulties.  He experiences occasional jaw pain, particularly when chewing, localized to the back of the jaw. The pain is absent at rest but occurs during certain movements. He has not had a recent dental appointment but is scheduled for one next week. He has had his wisdom teeth removed and wears a retainer at night due to previous teeth grinding.  HPI   Past Medical History:  Diagnosis Date   Dysplastic nevus 02/25/2014   R mid to upper back - moderate   Dysplastic nevus 02/25/2014   L mid back - mild    Dysplastic nevus 04/18/2015   L mid back 3.0 cm lat to spine - mild    Dysplastic nevus 04/18/2015   L lat waistline - moderate   Dysplastic nevus 09/22/2020   R upper back 4.0 cm lat to spine - moderate to severe    Dysplastic nevus 09/22/2020   L post axillary fold - moderate   Dysplastic nevus 02/16/2021   Right mid sup buttock - moderate   Dysplastic nevus 02/16/2021   right sacral/mid sup buttock - mild   Seasonal allergies     Past Surgical History:  Procedure Laterality Date   NO PAST SURGERIES      Family History  Problem Relation Age of Onset   Mental illness Mother    Hyperlipidemia Father    Hypertension Father    Hyperlipidemia Sister    Cancer Maternal Grandmother        uterus   Cancer Paternal  Grandfather        prostate, bone and lung cancer   Prostate cancer Maternal Uncle    Diabetes Neg Hx     Social History   Socioeconomic History   Marital status: Married    Spouse name: Not on file   Number of children: Not on file   Years of education: Not on file   Highest education level: Bachelor's degree (e.g., BA, AB, BS)  Occupational History   Not on file  Tobacco Use   Smoking status: Never   Smokeless tobacco: Never  Substance and Sexual Activity   Alcohol use: Yes    Alcohol/week: 0.0 standard drinks of alcohol    Comment: Does not drink regularly. Just has occasional beer possibly every couple of months.   Drug use: No   Sexual activity: Yes    Partners: Female    Comment: Wife  Other Topics Concern   Not on file  Social History Narrative   Works at [employer] in [employer]    Lives with wife and no children    Pets: 1 dog and 1 snake    Caffeine- 1-2 cups of coffee/soda/tea daily    Enjoys movies, computer games, reading  Social Drivers of Corporate Investment Banker Strain: Low Risk  (03/31/2024)   Overall Financial Resource Strain (CARDIA)    Difficulty of Paying Living Expenses: Not hard at all  Food Insecurity: No Food Insecurity (03/31/2024)   Hunger Vital Sign    Worried About Running Out of Food in the Last Year: Never true    Ran Out of Food in the Last Year: Never true  Transportation Needs: No Transportation Needs (03/31/2024)   PRAPARE - Administrator, Civil Service (Medical): No    Lack of Transportation (Non-Medical): No  Physical Activity: Sufficiently Active (03/31/2024)   Exercise Vital Sign    Days of Exercise per Week: 5 days    Minutes of Exercise per Session: 30 min  Stress: No Stress Concern Present (03/31/2024)   [employer] of Occupational Health - Occupational Stress Questionnaire    Feeling of Stress: Only a little  Social Connections: Socially Integrated (03/31/2024)   Social Connection and  Isolation Panel    Frequency of Communication with Friends and Family: Once a week    Frequency of Social Gatherings with Friends and Family: Twice a week    Attends Religious Services: More than 4 times per year    Active Member of [employer] or Organizations: Yes    Attends Engineer, Structural: More than 4 times per year    Marital Status: Married  Catering Manager Violence: Not on file     Outpatient Medications Prior to Visit  Medication Sig Dispense Refill   Magnesium 250 MG TABS      Zinc  30 MG TABS      No facility-administered medications prior to visit.    Allergies  Allergen Reactions   Pollen Extract Other (See Comments)    ROS Review of Systems Negative unless indicated in HPI.    Objective:    Physical Exam Constitutional:      Appearance: Normal appearance.  HENT:     Mouth/Throat:     Mouth: Mucous membranes are moist.  Eyes:     Conjunctiva/sclera: Conjunctivae normal.     Pupils: Pupils are equal, round, and reactive to light.  Cardiovascular:     Rate and Rhythm: Normal rate and regular rhythm.     Pulses: Normal pulses.     Heart sounds: Normal heart sounds.  Pulmonary:     Effort: Pulmonary effort is normal.     Breath sounds: Normal breath sounds.  Musculoskeletal:     Cervical back: Normal range of motion. No tenderness.  Skin:    General: Skin is warm.     Findings: No bruising.  Neurological:     General: No focal deficit present.     Mental Status: He is alert and oriented to person, place, and time. Mental status is at baseline.  Psychiatric:        Mood and Affect: Mood normal.        Behavior: Behavior normal.        Thought Content: Thought content normal.        Judgment: Judgment normal.     BP 118/76   Pulse 75   Temp 97.6 F (36.4 C)   Ht 5' 11 (1.803 m)   Wt 184 lb 3.2 oz (83.6 kg)   SpO2 99%   BMI 25.69 kg/m  Wt Readings from Last 3 Encounters:  04/03/24 184 lb 3.2 oz (83.6 kg)  07/16/23 194 lb (88 kg)   02/12/23 187 lb 12.8 oz (85.2 kg)  Health Maintenance  Topic Date Due   Hepatitis B Vaccines 19-59 Average Risk (1 of 3 - 19+ 3-dose series) Never done   HPV VACCINES (1 - Risk 3-dose SCDM series) Never done   COVID-19 Vaccine (4 - 2025-26 season) 04/18/2024 (Originally 02/10/2024)   DTaP/Tdap/Td (8 - Td or Tdap) 06/20/2030   Influenza Vaccine  Completed   Pneumococcal Vaccine  Aged Out   Meningococcal B Vaccine  Aged Out       Topic Date Due   Hepatitis B Vaccines 19-59 Average Risk (1 of 3 - 19+ 3-dose series) Never done   HPV VACCINES (1 - Risk 3-dose SCDM series) Never done    Lab Results  Component Value Date   TSH 1.12 07/16/2023   Lab Results  Component Value Date   WBC 6.6 07/16/2023   HGB 14.2 07/16/2023   HCT 41.7 07/16/2023   MCV 88.5 07/16/2023   PLT 258.0 07/16/2023   Lab Results  Component Value Date   NA 141 07/16/2023   K 4.1 07/16/2023   CO2 30 07/16/2023   GLUCOSE 81 07/16/2023   BUN 15 07/16/2023   CREATININE 0.99 07/16/2023   BILITOT 0.5 07/16/2023   ALKPHOS 19 (L) 07/16/2023   AST 19 07/16/2023   ALT 20 07/16/2023   PROT 7.4 07/16/2023   ALBUMIN 4.6 07/16/2023   CALCIUM 9.5 07/16/2023   GFR 95.34 07/16/2023   Lab Results  Component Value Date   CHOL 203 (H) 07/16/2023   Lab Results  Component Value Date   HDL 30.80 (L) 07/16/2023   Lab Results  Component Value Date   LDLCALC 126 (H) 07/16/2023   Lab Results  Component Value Date   TRIG 233.0 (H) 07/16/2023   Lab Results  Component Value Date   CHOLHDL 7 07/16/2023   Lab Results  Component Value Date   HGBA1C 5.4 07/16/2023      Assessment & Plan:  TMJ tenderness, right Assessment & Plan: Intermittent jaw pain during chewing, likely due to TMJ inflammation or bruxism. Retainer use may help mitigate bruxism. Pending dental evaluation. - Apply ice and alternate with heat to TMJ area for pain relief. - Use Orajel for swollen gums. - Discuss potential bruxism with  dentist at upcoming appointment.   Immunization due -     Flu vaccine trivalent PF, 6mos and older(Flulaval,Afluria,Fluarix,Fluzone)  Left foot pain Assessment & Plan: Resolved sharp pain on the inside of the foot, possibly due to ligament strain or sprain. - Perform warm compresses if pain recurs. - Soak foot in warm Epsom salt water if pain recurs.   Waxy discharge from ear Assessment & Plan: Slight ear wax with no redness or discharge bilaterally. Denise otalgia and discharge. -Advised to follow up is noticed red wax again.     Follow-up: No follow-ups on file.   Rio Kidane, NP

## 2024-04-03 NOTE — Patient Instructions (Addendum)
 Today, you were seen for red ear wax and jaw pain. We discussed your symptoms and provided recommendations for managing your jaw pain and any potential foot pain that may recur.  YOUR PLAN:  JAW PAIN: You have intermittent jaw pain during chewing, likely due to TMJ inflammation or teeth grinding (bruxism). -Apply ice and alternate with heat to the TMJ area for pain relief. -Use Orajel for swollen gums. -Discuss potential bruxism with your dentist at your upcoming appointment.  INTERMITTENT FOOT PAIN: You had a resolved sharp pain on the inside of your foot, possibly due to a ligament strain or sprain. -Perform warm compresses if the pain recurs. -Soak your foot in warm Epsom salt water if the pain recurs.  RED EAR WAX: Normal ear exam.

## 2024-04-10 DIAGNOSIS — M79672 Pain in left foot: Secondary | ICD-10-CM | POA: Insufficient documentation

## 2024-04-10 DIAGNOSIS — M26621 Arthralgia of right temporomandibular joint: Secondary | ICD-10-CM | POA: Insufficient documentation

## 2024-04-10 DIAGNOSIS — H6120 Impacted cerumen, unspecified ear: Secondary | ICD-10-CM | POA: Insufficient documentation

## 2024-04-10 NOTE — Assessment & Plan Note (Signed)
 Resolved sharp pain on the inside of the foot, possibly due to ligament strain or sprain. - Perform warm compresses if pain recurs. - Soak foot in warm Epsom salt water if pain recurs.

## 2024-04-10 NOTE — Assessment & Plan Note (Signed)
 Slight ear wax with no redness or discharge bilaterally. Denise otalgia and discharge. -Advised to follow up is noticed red wax again.

## 2024-04-10 NOTE — Assessment & Plan Note (Signed)
 Intermittent jaw pain during chewing, likely due to TMJ inflammation or bruxism. Retainer use may help mitigate bruxism. Pending dental evaluation. - Apply ice and alternate with heat to TMJ area for pain relief. - Use Orajel for swollen gums. - Discuss potential bruxism with dentist at upcoming appointment.

## 2024-07-13 ENCOUNTER — Ambulatory Visit: Payer: 59 | Admitting: Dermatology

## 2024-07-15 ENCOUNTER — Ambulatory Visit: Admitting: Dermatology

## 2024-07-16 ENCOUNTER — Encounter: Payer: Self-pay | Admitting: Nurse Practitioner

## 2024-07-16 ENCOUNTER — Ambulatory Visit: Payer: 59 | Admitting: Nurse Practitioner

## 2024-07-16 VITALS — BP 112/62 | HR 66 | Temp 98.3°F | Ht 71.0 in | Wt 181.8 lb

## 2024-07-16 DIAGNOSIS — Z0000 Encounter for general adult medical examination without abnormal findings: Secondary | ICD-10-CM

## 2024-07-16 DIAGNOSIS — E782 Mixed hyperlipidemia: Secondary | ICD-10-CM

## 2024-07-16 DIAGNOSIS — G8929 Other chronic pain: Secondary | ICD-10-CM

## 2024-07-16 DIAGNOSIS — E663 Overweight: Secondary | ICD-10-CM

## 2024-07-16 DIAGNOSIS — Z1329 Encounter for screening for other suspected endocrine disorder: Secondary | ICD-10-CM

## 2024-07-16 LAB — CBC WITH DIFFERENTIAL/PLATELET
Basophils Absolute: 0 10*3/uL (ref 0.0–0.1)
Basophils Relative: 0.5 % (ref 0.0–3.0)
Eosinophils Absolute: 0.4 10*3/uL (ref 0.0–0.7)
Eosinophils Relative: 7 % — ABNORMAL HIGH (ref 0.0–5.0)
HCT: 42.8 % (ref 39.0–52.0)
Hemoglobin: 14.8 g/dL (ref 13.0–17.0)
Lymphocytes Relative: 31.1 % (ref 12.0–46.0)
Lymphs Abs: 1.8 10*3/uL (ref 0.7–4.0)
MCHC: 34.5 g/dL (ref 30.0–36.0)
MCV: 87.4 fl (ref 78.0–100.0)
Monocytes Absolute: 0.6 10*3/uL (ref 0.1–1.0)
Monocytes Relative: 9.4 % (ref 3.0–12.0)
Neutro Abs: 3.1 10*3/uL (ref 1.4–7.7)
Neutrophils Relative %: 52 % (ref 43.0–77.0)
Platelets: 260 10*3/uL (ref 150.0–400.0)
RBC: 4.89 Mil/uL (ref 4.22–5.81)
RDW: 12.3 % (ref 11.5–15.5)
WBC: 5.9 10*3/uL (ref 4.0–10.5)

## 2024-07-16 LAB — COMPREHENSIVE METABOLIC PANEL WITH GFR
ALT: 21 U/L (ref 3–53)
AST: 19 U/L (ref 5–37)
Albumin: 4.8 g/dL (ref 3.5–5.2)
Alkaline Phosphatase: 19 U/L — ABNORMAL LOW (ref 39–117)
BUN: 18 mg/dL (ref 6–23)
CO2: 35 meq/L — ABNORMAL HIGH (ref 19–32)
Calcium: 9.8 mg/dL (ref 8.4–10.5)
Chloride: 103 meq/L (ref 96–112)
Creatinine, Ser: 0.98 mg/dL (ref 0.40–1.50)
GFR: 95.83 mL/min
Glucose, Bld: 63 mg/dL — ABNORMAL LOW (ref 70–99)
Potassium: 4.3 meq/L (ref 3.5–5.1)
Sodium: 142 meq/L (ref 135–145)
Total Bilirubin: 0.6 mg/dL (ref 0.2–1.2)
Total Protein: 7.5 g/dL (ref 6.0–8.3)

## 2024-07-16 LAB — LIPID PANEL
Cholesterol: 218 mg/dL — ABNORMAL HIGH (ref 28–200)
HDL: 35.8 mg/dL — ABNORMAL LOW
LDL Cholesterol: 154 mg/dL — ABNORMAL HIGH (ref 10–99)
NonHDL: 182.54
Total CHOL/HDL Ratio: 6
Triglycerides: 144 mg/dL (ref 10.0–149.0)
VLDL: 28.8 mg/dL (ref 0.0–40.0)

## 2024-07-16 LAB — TSH: TSH: 0.81 u[IU]/mL (ref 0.35–5.50)

## 2024-07-16 LAB — HEMOGLOBIN A1C: Hgb A1c MFr Bld: 5.3 % (ref 4.6–6.5)

## 2024-07-16 NOTE — Assessment & Plan Note (Signed)
 Physical exam complete. We will check lab work as outlined. Colonoscopy and PSA screening not indicated. Flu and tetanus vaccines are up to date. Declines additional COVID vaccines. Continue routine dental and eye exams. Encourage healthy diet and regular exercise. Return to care in one year, sooner as needed.

## 2024-07-16 NOTE — Assessment & Plan Note (Signed)
 Managed with diet and exercise. Advised to reduce carbohydrates and fats. Continue current exercise regimen and dietary modifications. Check A1c.

## 2024-07-16 NOTE — Assessment & Plan Note (Signed)
 Pain is positional and musculoskeletal, influenced by sleep position and exercise. There is no radiation of pain, numbness, tingling, or changes in bowel or bladder function. Continue exercise regimen. Monitor and adjust sleep position as needed. Consider physical therapy for strengthening exercises and stretches if worsening.

## 2024-07-16 NOTE — Assessment & Plan Note (Signed)
 Current management with diet and exercise. Check lipid panel. Continue diet and exercise regimen. The 10-year ASCVD risk score (Arnett DK, et al., 2019) is: 2%.

## 2024-07-16 NOTE — Progress Notes (Signed)
 " [name], NP-C Phone: [phone]  Discussed the use of AI scribe software for clinical note transcription with the patient, who gave verbal consent to proceed.  History of Present Illness   Keith Cummings is a 44 year old male who presents for an annual physical exam.  He is concerned about his thyroid  levels due to a family history of thyroid  issues, specifically in his mother. His thyroid  levels were checked last year and were normal. He wants them checked again as part of his routine blood work.  He has a habit of biting the inside of his lip, which he attributes to stress or anxiety. He is unsure if diet plays a role but notes it occurs more after consuming certain foods or drinks. He denies noticing sores or raw areas inside his mouth.  He has a history of high cholesterol and high triglycerides, which he manages with diet and exercise. His LDL cholesterol was 126 mg/dL last year, down from 849 mg/dL the previous year. He is not on cholesterol medication and is focused on maintaining a healthy lifestyle. He engages in light exercise, including walking and weight training, and has been working on reducing sugar intake. He describes his diet as generally balanced but acknowledges room for improvement, particularly in reducing carbs and fats.  He experiences intermittent back pain, which he associates with certain positions and sleep posture. He has not pursued physical therapy but has been working on exercises to strengthen his back.  He experiences occasional headaches related to hunger or environmental changes. No chest pain, shortness of breath, or gastrointestinal issues. He sleeps approximately seven hours per night, though he would prefer eight. He feels generally well-rested but acknowledges feeling tired at times.        Tobacco Use History[1]  Medications Ordered Prior to Encounter[2]   ROS see history of present illness  Objective  Physical Exam Vitals:    07/16/24 0902  BP: 112/62  Pulse: 66  Temp: 98.3 F (36.8 C)  SpO2: 96%    BP Readings from Last 3 Encounters:  07/16/24 112/62  04/03/24 118/76  07/16/23 110/72   Wt Readings from Last 3 Encounters:  07/16/24 181 lb 12.8 oz (82.5 kg)  04/03/24 184 lb 3.2 oz (83.6 kg)  07/16/23 194 lb (88 kg)    Physical Exam Constitutional:      General: He is not in acute distress.    Appearance: Normal appearance.  HENT:     Head: Normocephalic.     Right Ear: Tympanic membrane normal.     Left Ear: Tympanic membrane normal.     Nose: Nose normal.     Mouth/Throat:     Mouth: Mucous membranes are moist.     Pharynx: Oropharynx is clear.  Eyes:     Conjunctiva/sclera: Conjunctivae normal.     Pupils: Pupils are equal, round, and reactive to light.  Neck:     Thyroid : No thyromegaly.  Cardiovascular:     Rate and Rhythm: Normal rate and regular rhythm.     Heart sounds: Normal heart sounds.  Pulmonary:     Effort: Pulmonary effort is normal.     Breath sounds: Normal breath sounds.  Abdominal:     General: Abdomen is flat. Bowel sounds are normal.     Palpations: Abdomen is soft. There is no mass.     Tenderness: There is no abdominal tenderness.  Musculoskeletal:        General: Normal range of motion.  Lymphadenopathy:  Cervical: No cervical adenopathy.  Skin:    General: Skin is warm and dry.     Findings: No rash.  Neurological:     General: No focal deficit present.     Mental Status: He is alert.  Psychiatric:        Mood and Affect: Mood normal.        Behavior: Behavior normal.      Assessment/Plan: Please see individual problem list.  Routine general medical examination at a health care facility Assessment & Plan: Physical exam complete. We will check lab work as outlined. Colonoscopy and PSA screening not indicated. Flu and tetanus vaccines are up to date. Declines additional COVID vaccines. Continue routine dental and eye exams. Encourage healthy  diet and regular exercise. Return to care in one year, sooner as needed.   Orders: -     CBC with Differential/Platelet -     Comprehensive metabolic panel with GFR  Mixed hyperlipidemia Assessment & Plan: Current management with diet and exercise. Check lipid panel. Continue diet and exercise regimen. The 10-year ASCVD risk score (Arnett DK, et al., 2019) is: 2%.   Orders: -     Lipid panel  Chronic bilateral low back pain without sciatica Assessment & Plan: Pain is positional and musculoskeletal, influenced by sleep position and exercise. There is no radiation of pain, numbness, tingling, or changes in bowel or bladder function. Continue exercise regimen. Monitor and adjust sleep position as needed. Consider physical therapy for strengthening exercises and stretches if worsening.    Overweight (BMI 25.0-29.9) Assessment & Plan: Managed with diet and exercise. Advised to reduce carbohydrates and fats. Continue current exercise regimen and dietary modifications. Check A1c.   Orders: -     Hemoglobin A1c  Thyroid  disorder screen -     TSH     Return in about 1 year (around 07/16/2025) for Annual Exam, sooner as needed.   [name], NP-C [facility] [place]    [1]  Social History Tobacco Use  Smoking Status Never  Smokeless Tobacco Never  [2]  Current Outpatient Medications on File Prior to Visit  Medication Sig Dispense Refill   Magnesium 250 MG TABS      Zinc  30 MG TABS      No current facility-administered medications on file prior to visit.   "

## 2025-07-20 ENCOUNTER — Encounter: Admitting: Nurse Practitioner
